# Patient Record
Sex: Male | Born: 1940 | Race: Black or African American | Hispanic: No | Smoking: Former smoker
Health system: Southern US, Community
[De-identification: ages and names within clinical notes are randomized; demographics above are authoritative.]

## PROBLEM LIST (undated history)

## (undated) DIAGNOSIS — F32A Depression, unspecified: Secondary | ICD-10-CM

## (undated) DIAGNOSIS — Z77098 Contact with and (suspected) exposure to other hazardous, chiefly nonmedicinal, chemicals: Secondary | ICD-10-CM

## (undated) DIAGNOSIS — C801 Malignant (primary) neoplasm, unspecified: Secondary | ICD-10-CM

## (undated) DIAGNOSIS — C61 Malignant neoplasm of prostate: Secondary | ICD-10-CM

## (undated) DIAGNOSIS — K219 Gastro-esophageal reflux disease without esophagitis: Secondary | ICD-10-CM

## (undated) DIAGNOSIS — Z8673 Personal history of transient ischemic attack (TIA), and cerebral infarction without residual deficits: Secondary | ICD-10-CM

## (undated) DIAGNOSIS — J329 Chronic sinusitis, unspecified: Secondary | ICD-10-CM

## (undated) DIAGNOSIS — F329 Major depressive disorder, single episode, unspecified: Secondary | ICD-10-CM

## (undated) DIAGNOSIS — I10 Essential (primary) hypertension: Secondary | ICD-10-CM

## (undated) HISTORY — DX: Gastro-esophageal reflux disease without esophagitis: K21.9

## (undated) HISTORY — DX: Malignant (primary) neoplasm, unspecified: C80.1

## (undated) HISTORY — DX: Essential (primary) hypertension: I10

## (undated) HISTORY — DX: Major depressive disorder, single episode, unspecified: F32.9

## (undated) HISTORY — DX: Contact with and (suspected) exposure to other hazardous, chiefly nonmedicinal, chemicals: Z77.098

## (undated) HISTORY — DX: Malignant neoplasm of prostate: C61

## (undated) HISTORY — PX: OTHER SURGICAL HISTORY: SHX169

## (undated) HISTORY — DX: Chronic sinusitis, unspecified: J32.9

## (undated) HISTORY — DX: Depression, unspecified: F32.A

## (undated) HISTORY — PX: TONSILLECTOMY: SUR1361

---

## 1998-06-18 HISTORY — PX: OTHER SURGICAL HISTORY: SHX169

## 1998-10-28 ENCOUNTER — Ambulatory Visit (HOSPITAL_COMMUNITY): Admission: RE | Admit: 1998-10-28 | Discharge: 1998-10-28 | Payer: Self-pay | Admitting: *Deleted

## 1999-05-22 ENCOUNTER — Encounter: Admission: RE | Admit: 1999-05-22 | Discharge: 1999-05-22 | Payer: Self-pay | Admitting: Internal Medicine

## 1999-05-22 ENCOUNTER — Encounter: Payer: Self-pay | Admitting: Internal Medicine

## 2000-04-22 ENCOUNTER — Encounter: Admission: RE | Admit: 2000-04-22 | Discharge: 2000-07-21 | Payer: Self-pay | Admitting: Endocrinology

## 2001-04-09 ENCOUNTER — Encounter: Admission: RE | Admit: 2001-04-09 | Discharge: 2001-04-09 | Payer: Self-pay | Admitting: *Deleted

## 2001-04-09 ENCOUNTER — Encounter: Payer: Self-pay | Admitting: Allergy and Immunology

## 2001-05-20 ENCOUNTER — Ambulatory Visit (HOSPITAL_COMMUNITY): Admission: RE | Admit: 2001-05-20 | Discharge: 2001-05-20 | Payer: Self-pay | Admitting: *Deleted

## 2001-05-20 ENCOUNTER — Encounter (INDEPENDENT_AMBULATORY_CARE_PROVIDER_SITE_OTHER): Payer: Self-pay | Admitting: *Deleted

## 2002-10-30 ENCOUNTER — Ambulatory Visit (HOSPITAL_BASED_OUTPATIENT_CLINIC_OR_DEPARTMENT_OTHER): Admission: RE | Admit: 2002-10-30 | Discharge: 2002-10-30 | Payer: Self-pay | Admitting: Ophthalmology

## 2004-04-03 ENCOUNTER — Ambulatory Visit (HOSPITAL_COMMUNITY): Admission: RE | Admit: 2004-04-03 | Discharge: 2004-04-03 | Payer: Self-pay | Admitting: Internal Medicine

## 2004-12-15 ENCOUNTER — Encounter: Admission: RE | Admit: 2004-12-15 | Discharge: 2004-12-15 | Payer: Self-pay | Admitting: Allergy and Immunology

## 2006-06-03 ENCOUNTER — Encounter (INDEPENDENT_AMBULATORY_CARE_PROVIDER_SITE_OTHER): Payer: Self-pay | Admitting: *Deleted

## 2006-06-03 ENCOUNTER — Ambulatory Visit (HOSPITAL_COMMUNITY): Admission: RE | Admit: 2006-06-03 | Discharge: 2006-06-03 | Payer: Self-pay | Admitting: *Deleted

## 2008-02-26 ENCOUNTER — Ambulatory Visit (HOSPITAL_COMMUNITY): Admission: RE | Admit: 2008-02-26 | Discharge: 2008-02-26 | Payer: Self-pay | Admitting: *Deleted

## 2009-05-24 ENCOUNTER — Encounter: Admission: RE | Admit: 2009-05-24 | Discharge: 2009-06-16 | Payer: Self-pay | Admitting: Internal Medicine

## 2009-06-20 ENCOUNTER — Encounter: Admission: RE | Admit: 2009-06-20 | Discharge: 2009-06-27 | Payer: Self-pay | Admitting: Internal Medicine

## 2010-04-12 DIAGNOSIS — C61 Malignant neoplasm of prostate: Secondary | ICD-10-CM

## 2010-04-12 HISTORY — DX: Malignant neoplasm of prostate: C61

## 2010-05-16 ENCOUNTER — Ambulatory Visit
Admission: RE | Admit: 2010-05-16 | Discharge: 2010-06-15 | Payer: Self-pay | Source: Home / Self Care | Attending: Radiation Oncology | Admitting: Radiation Oncology

## 2010-07-10 ENCOUNTER — Ambulatory Visit
Admission: RE | Admit: 2010-07-10 | Discharge: 2010-07-18 | Payer: Self-pay | Source: Home / Self Care | Attending: Radiation Oncology | Admitting: Radiation Oncology

## 2010-10-31 NOTE — Op Note (Signed)
NAMELASZLO, ELLERBY              ACCOUNT NO.:  [id number]   MEDICAL RECORD NO.:  [id number]          PATIENT TYPE:  AMB   LOCATION:  ENDO                         FACILITY:  [facility]   PHYSICIAN:  Georgiana Spinner, M.D.    DATE OF BIRTH:  [birth year]   DATE OF PROCEDURE:  02/26/2008  DATE OF DISCHARGE:                               OPERATIVE REPORT   PROCEDURE:  Colonoscopy.   INDICATIONS:  Rectal bleeding.   ANESTHESIA:  Demerol 50 mg, Versed 5 mg   PROCEDURE:  With the patient mildly sedated in the left lateral  decubitus position, the Pentax videoscopic colonoscope was inserted in  the rectum after a normal rectal exam, passed under direct vision to the  cecum, identified by ileocecal valve and appendiceal orifice, both of  which were photographed.  From this point the colonoscope was slowly  withdrawn taking circumferential views of colonic mucosa as we withdrew  all the way to the rectum, which appeared normal on direct and showed  internal hemorrhoids on retroflexed view.  The endoscope was  straightened and withdrawn.  The patient's vital signs and pulse  oximeter remained stable.  The patient tolerated the procedure well  without apparent complications.   FINDINGS:  Internal hemorrhoids, otherwise an unremarkable exam.   PLAN:  Have patient follow up with me in 5 years for repeat colonoscopy  or as needed.           ______________________________  Georgiana Spinner, M.D.     GMO/MEDQ  D:  02/26/2008  T:  02/26/2008  Job:  [id number]

## 2010-11-03 NOTE — Procedures (Signed)
[place]  Patient:    James Acevedo, ANDER Visit Number: [id number] MRN: [id number]          Service Type: END Location: ENDO Attending Physician:  Sabino Gasser Dictated by:   Sabino Gasser, M.D. Admit Date:  05/20/2001                             Procedure Report  PROCEDURE:  Colonoscopy.  ENDOSCOPIST:  Sabino Gasser, M.D.  INDICATIONS:  Hemoccult positivity.  ANESTHESIA:  None further given.  DESCRIPTION OF PROCEDURE:  With the patient mildly sedated in the left lateral decubitus position, a rectal exam was performed which was unremarkable. Subsequently the Olympus videoscopic colonoscope was inserted in the rectum and passed under direction vision into the cecum.  Prep was slightly suboptimal in that there were areas of solid stool, seen especially in the right colon, but the cecum was identified by ileocecal valve and appendiceal orifice, both of which were photographed.  From this point, the colonoscope was slowly withdrawn, taking circumferential views of the entire colonic mucosa, stopping only in the rectum which appeared normal on direct and showed hemorrhoids on retroflexed view.  The endoscope was straightened and withdrawn.  The patients vital signs and pulse oximetry remained stable.  The patient tolerated procedure well without apparent complications.  FINDINGS:  Internal hemorrhoids, otherwise unremarkable examination.  PLAN:  Repeat examination in five years. Dictated by:   Sabino Gasser, M.D. Attending Physician:  Sabino Gasser DD:  05/20/01 TD:  05/20/01 Job: [id number]

## 2011-08-01 ENCOUNTER — Other Ambulatory Visit (HOSPITAL_COMMUNITY): Payer: Self-pay | Admitting: Radiology

## 2011-08-01 DIAGNOSIS — G459 Transient cerebral ischemic attack, unspecified: Secondary | ICD-10-CM

## 2011-08-02 ENCOUNTER — Ambulatory Visit (HOSPITAL_COMMUNITY): Payer: Medicare Other | Attending: Cardiology | Admitting: Radiology

## 2011-08-02 DIAGNOSIS — G459 Transient cerebral ischemic attack, unspecified: Secondary | ICD-10-CM | POA: Insufficient documentation

## 2011-08-02 DIAGNOSIS — E785 Hyperlipidemia, unspecified: Secondary | ICD-10-CM | POA: Insufficient documentation

## 2011-08-02 DIAGNOSIS — Z87891 Personal history of nicotine dependence: Secondary | ICD-10-CM | POA: Insufficient documentation

## 2011-08-02 DIAGNOSIS — I10 Essential (primary) hypertension: Secondary | ICD-10-CM | POA: Insufficient documentation

## 2011-08-02 DIAGNOSIS — E669 Obesity, unspecified: Secondary | ICD-10-CM | POA: Insufficient documentation

## 2011-08-02 HISTORY — PX: PROSTATE BIOPSY: SHX241

## 2011-08-06 ENCOUNTER — Encounter (HOSPITAL_COMMUNITY): Payer: Self-pay | Admitting: Neurology

## 2011-09-27 ENCOUNTER — Encounter: Payer: Self-pay | Admitting: Radiation Oncology

## 2011-09-27 DIAGNOSIS — C61 Malignant neoplasm of prostate: Secondary | ICD-10-CM | POA: Insufficient documentation

## 2011-10-02 ENCOUNTER — Ambulatory Visit
Admission: RE | Admit: 2011-10-02 | Discharge: 2011-10-02 | Disposition: A | Discharge status: Home / Self Care | Payer: Medicare Other | Source: Ambulatory Visit | Attending: Radiation Oncology | Admitting: Radiation Oncology

## 2011-10-02 ENCOUNTER — Encounter: Payer: Self-pay | Admitting: Radiation Oncology

## 2011-10-02 VITALS — BP 123/74 | HR 58 | Temp 98.4°F | Resp 18 | Ht 74.0 in | Wt 273.6 lb

## 2011-10-02 DIAGNOSIS — C61 Malignant neoplasm of prostate: Secondary | ICD-10-CM | POA: Insufficient documentation

## 2011-10-02 DIAGNOSIS — K219 Gastro-esophageal reflux disease without esophagitis: Secondary | ICD-10-CM | POA: Insufficient documentation

## 2011-10-02 NOTE — Progress Notes (Signed)
   Treatment planning note: The patient underwent treatment planning today in preparation of his prostate seed implant. He was taken to the CT simulator. His pelvis was scanned. I contoured his prostate. The prostate volume was projected over the bony arch. There was no pubic arch interference. His prostate gland volume is approximately 30 cc with a prosthetic length of 4.2 cm. I prescribing 14,500 cGy with the Nucletron system and I-125 seeds.

## 2011-10-02 NOTE — Progress Notes (Signed)
Patient presents to the clinic today unaccompanied for a follow up new consult with Dr. Dayton Scrape reference prostate ca. Patient is alert and oriented to person, place, and time. No distress noted. Steady gait noted. Pleasant affect noted. Patient denies pain at this time. Patient reports that his weight has remained stable. Patient continues to play tennis 3-4 times per week. Patient denies nausea, vomiting, dizziness, headache, or diarrhea. Patient denies hematuria or burning with urination. Patient reports a weak urine stream but, denies "dribbling or dripping" upon urination. Patient denies difficulty emptying his bladder.   08/02/2011 PSA 4.07  IPSS 5

## 2011-10-02 NOTE — Progress Notes (Signed)
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 Barron Alvine  [name] returns today for review and consideration of radiation therapy in the management of his stage TI C. favorable risk adenocarcinoma prostate. I first saw [name] in consultation on 05/17/2010. He elected for close observation. I saw him back for a followup visit on 07/11/2010 at which time he was undecided between possible IMRT or prostate seed implantation. He continued to be followed by Dr. Isabel Caprice. His last PSA on 07/31/2011 was 4.07. Dr. Isabel Caprice repeated his biopsies on 03/29/2012 and this showed Gleason 6 (3+3) involving 50% of one core from the right lateral base, 5% of one core from right lateral apex, 5% of one core from the left lateral base and 5% of one core from the left lateral mid gland. His gland volume was 35 cc. He is doing well from a GU and GI standpoint. His I PSS score is 5. 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  Physical examination: Alert and oriented. Head and neck examination: Grossly unremarkable. Nodes: Without palpable cervical or supraclavicular lymphadenopathy. Chest: Lungs clear. Back: Without spinal or CVA tenderness. Heart: Regular rhythm. Abdomen: Without masses or organomegaly. Genitalia: Unremarkable to inspection. Rectal: The prostate gland is normal size it is without focal induration or nodularity. Extremities: Without edema.  Laboratory data: PSA 4.07 from 07/31/2011.  Impression: Stage TI C. favorable risk adenocarcinoma prostate. I explained to the patient that his management options include surgery versus seed implantation versus external beam/IMRT. Considering his financial concerns I feel that his best management option would be seed implantation. I discussed the potential acute and late toxicities in great detail. Consent is signed today. We will schedule him for a CT arch study today and then  for seed implant with Dr. Isabel Caprice in 4-6 weeks.  30 minutes was spent face-to-face with the patient, primarily counseling the patient.

## 2011-10-02 NOTE — Progress Notes (Signed)
Please see progress note under physician encounter 

## 2011-10-02 NOTE — Progress Notes (Signed)
Complete NUTRITION RISK SCREEN worksheet without concerns submitted to Zenovia Jarred, RD. Also, complete PATIENT MEASURE OF DISTRESS worksheet with a score of 3 submitted to social work.

## 2011-10-02 NOTE — Progress Notes (Addendum)
71 year old male. Retired Medical illustrator. Widower of ten years, no children. Wife died from breast cancer.   Oct 28, 2010 PSA 3.5 01/2011 PSA 4.0  Originally diagnosed with prostate cancer in October 2011 and underwent active surveillance. Prostate volume was measure to be 35 cc on 07/31/2011 at the time of repeat biopsy. Pathology proved adenocarcinoma of the prostate, Gleason 3+3=6 involves 50% of one core at right lateral base, Gleason 3+3=6 involving 5% of one core right lateral apex, 3+3=6 involving 5% of one core left lateral mid, and 3+3=6 involving 5 % of one core left lateral mid.    NKDA No indication of a pacemaker No hx of radiation therapy

## 2011-10-04 NOTE — Progress Notes (Signed)
Encounter addended by: Agnes Lawrence, RN on: 10/04/2011  3:04 PM<BR>     Documentation filed: Charges VN, Inpatient Patient Education

## 2011-10-08 ENCOUNTER — Other Ambulatory Visit: Payer: Self-pay | Admitting: Urology

## 2011-10-18 ENCOUNTER — Telehealth: Payer: Self-pay | Admitting: *Deleted

## 2011-10-18 NOTE — Telephone Encounter (Signed)
xxxxx 

## 2011-10-24 ENCOUNTER — Ambulatory Visit (HOSPITAL_BASED_OUTPATIENT_CLINIC_OR_DEPARTMENT_OTHER)
Admission: RE | Admit: 2011-10-24 | Discharge: 2011-10-24 | Disposition: A | Discharge status: Home / Self Care | Payer: Medicare Other | Source: Ambulatory Visit | Attending: Urology | Admitting: Urology

## 2011-10-24 ENCOUNTER — Telehealth: Payer: Self-pay | Admitting: *Deleted

## 2011-10-24 ENCOUNTER — Ambulatory Visit (HOSPITAL_COMMUNITY)
Admission: RE | Admit: 2011-10-24 | Discharge: 2011-10-24 | Disposition: A | Discharge status: Home / Self Care | Payer: Medicare Other | Source: Ambulatory Visit | Attending: Urology | Admitting: Urology

## 2011-10-24 DIAGNOSIS — I771 Stricture of artery: Secondary | ICD-10-CM | POA: Insufficient documentation

## 2011-10-24 DIAGNOSIS — Z01818 Encounter for other preprocedural examination: Secondary | ICD-10-CM | POA: Insufficient documentation

## 2011-10-24 DIAGNOSIS — I10 Essential (primary) hypertension: Secondary | ICD-10-CM | POA: Insufficient documentation

## 2011-10-24 DIAGNOSIS — C61 Malignant neoplasm of prostate: Secondary | ICD-10-CM | POA: Insufficient documentation

## 2011-10-24 DIAGNOSIS — I517 Cardiomegaly: Secondary | ICD-10-CM | POA: Insufficient documentation

## 2011-10-24 NOTE — Telephone Encounter (Signed)
CALLED PATIENT TO INFORM OF IMPLANT DATE BEING MOVED FROM 11-19-11 TO 11-26-11, SPOKE WITH PATIENT AND HE IS AWARE OF THIS CHANGE AND HE IS O.K. WITH IT

## 2011-11-16 ENCOUNTER — Telehealth: Payer: Self-pay | Admitting: *Deleted

## 2011-11-16 NOTE — Telephone Encounter (Signed)
Called patient to remind of appt. For 11-19-11, lvm for a return call

## 2011-11-19 ENCOUNTER — Encounter (HOSPITAL_BASED_OUTPATIENT_CLINIC_OR_DEPARTMENT_OTHER): Payer: Self-pay | Admitting: *Deleted

## 2011-11-19 LAB — CBC
Hemoglobin: 14.6 g/dL (ref 13.0–17.0)
MCH: 29.3 pg (ref 26.0–34.0)
MCHC: 33.3 g/dL (ref 30.0–36.0)
MCV: 87.8 fL (ref 78.0–100.0)
Platelets: 169 10*3/uL (ref 150–400)
RBC: 4.99 MIL/uL (ref 4.22–5.81)

## 2011-11-19 LAB — COMPREHENSIVE METABOLIC PANEL
CO2: 26 mEq/L (ref 19–32)
Calcium: 8.8 mg/dL (ref 8.4–10.5)
Creatinine, Ser: 0.98 mg/dL (ref 0.50–1.35)
GFR calc Af Amer: >90 mL/min (ref >90)
GFR calc non Af Amer: 82 mL/min — ABNORMAL LOW (ref >90)
Glucose, Bld: 120 mg/dL — ABNORMAL HIGH (ref 70–99)
Total Protein: 6.7 g/dL (ref 6.0–8.3)

## 2011-11-19 LAB — PROTIME-INR: INR: 1 (ref 0.00–1.49)

## 2011-11-19 LAB — APTT: aPTT: 26 seconds (ref 24–37)

## 2011-11-19 NOTE — Progress Notes (Signed)
NPO AFTER MN. ARRIVES AT 0615. CURRENT LAB , EKG AND CXR IN EPIC AND CHART. WILL DO SYMBICORT INHALER AM OF SURG W/ SIPS OF WATER AND DO FLEET ENEMA.

## 2011-11-23 ENCOUNTER — Telehealth: Payer: Self-pay | Admitting: *Deleted

## 2011-11-23 NOTE — Telephone Encounter (Signed)
CALLED PATIENT TO REMIND OF PROCEDURE FOR 11-26-11, LVM FOR A RETURN CALL

## 2011-11-23 NOTE — H&P (Signed)
James Acevedo is an 71 y.o. male.   Chief Complaint: Prostate cancer for seed implantation today. 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  That was diagnosed in October of 2011.  The patient was deemed a good candidate for active surveillance.  We felt that surgical intervention would be overaggressive therapy but also felt that he would be a good candidate for radiation approaches. He has seen Dr. Dayton Scrape and has discussed those options with him.  He has also been seen in the [place] system to see if that would be a more cost effective way for him to be treated.  At this point he is leaning towards having seed implantation here in [place], but is not completely ready to commit to that at this point.  Certainly given his very favorable prostate cancer, he certainly has plenty of time to make an appropriate decision and certainly again could continue with active surveillance.  PSA 4 months ago was stable at 3.5, really no change from 4 months earlier and actually down from last fall.  He has used the penile injections with variable results but is doing okay. The patient had a repeat biopsy in February of 2013. This showed a 4/12 cores positive for low volume Gleason 6 adenocarcinoma the prostate. The patient was actually seen by radiation oncology here in [place] and elected to proceed with seed implantation.  Past Medical History  Diagnosis Date  . Depression   . Asthma   . Agent orange exposure   . Hypertension   . Prostate cancer 04/12/10    gleason 3+3=6, volume 40 cc  . GERD (gastroesophageal reflux disease)   . History of TIA (transient ischemic attack) OCTOBER 2012--  NO RESIDUAL    NEURO CONSULT IN FEB 2013    Past Surgical History  Procedure Date  . Prostate biopsy 08/02/11    gleason 3+3=6, volume 35 cc  . Tonsillectomy AGE 69  . Exicison lipoma     BACK  IN 2006  AND HEAD  IN 2009  . Left wrist reconstruction 2000     Family History  Problem Relation Age of Onset  . Cancer Mother     lung  . COPD Father   . Cancer Maternal Aunt     unknown//blood?  . Cancer Maternal Uncle     unknown   Social History:  reports that he quit smoking about 33 years ago. His smoking use included Cigarettes. He has a 10 pack-year smoking history. He has never used smokeless tobacco. He reports that he drinks alcohol. He reports that he does not use illicit drugs.  Allergies: No Known Allergies  No prescriptions prior to admission    No results found for this or any previous visit (from the past 48 hour(Acevedo)). No results found.  Review of Systems - Negative except  Urinary frequency, urgency and weakening of the urinary stream. Patient also has some chronic sinus issues.  Height 6\' 2"  (1.88 m), weight 123.832 kg (273 lb). General appearance: alert, cooperative and no distress Neck: no adenopathy and no JVD Resp: clear to auscultation bilaterally Cardio: regular rate and rhythm GI: soft, non-tender; bowel sounds normal; no masses,  no organomegaly Male genitalia: normal, penis: no lesions or discharge. testes: no masses or tenderness. no hernias Pelvic: 1+ prostate without nodules or induration. Extremities: extremities normal, atraumatic, no cyanosis or edema Skin: Skin color, texture, turgor normal. No rashes or lesions Neurologic: Grossly normal  Assessment/Plan Clinical stage TI  C. Adenocarcinoma of the prostate. The patient is to have outpatient seed Implantation today.  James Acevedo 11/23/2011, 12:13 PM

## 2011-11-26 ENCOUNTER — Ambulatory Visit (HOSPITAL_BASED_OUTPATIENT_CLINIC_OR_DEPARTMENT_OTHER): Payer: Medicare Other | Admitting: Anesthesiology

## 2011-11-26 ENCOUNTER — Encounter (HOSPITAL_BASED_OUTPATIENT_CLINIC_OR_DEPARTMENT_OTHER)
Admission: RE | Disposition: A | Discharge status: Home / Self Care | Payer: Self-pay | Source: Ambulatory Visit | Attending: Urology

## 2011-11-26 ENCOUNTER — Encounter (HOSPITAL_BASED_OUTPATIENT_CLINIC_OR_DEPARTMENT_OTHER): Payer: Self-pay | Admitting: Anesthesiology

## 2011-11-26 ENCOUNTER — Ambulatory Visit (HOSPITAL_COMMUNITY): Payer: Medicare Other

## 2011-11-26 ENCOUNTER — Encounter (HOSPITAL_BASED_OUTPATIENT_CLINIC_OR_DEPARTMENT_OTHER): Payer: Self-pay | Admitting: *Deleted

## 2011-11-26 ENCOUNTER — Encounter: Payer: Self-pay | Admitting: Radiation Oncology

## 2011-11-26 ENCOUNTER — Ambulatory Visit (HOSPITAL_BASED_OUTPATIENT_CLINIC_OR_DEPARTMENT_OTHER)
Admission: RE | Admit: 2011-11-26 | Discharge: 2011-11-26 | Disposition: A | Discharge status: Home / Self Care | Payer: Medicare Other | Source: Ambulatory Visit | Attending: Urology | Admitting: Urology

## 2011-11-26 DIAGNOSIS — I10 Essential (primary) hypertension: Secondary | ICD-10-CM | POA: Insufficient documentation

## 2011-11-26 DIAGNOSIS — C61 Malignant neoplasm of prostate: Secondary | ICD-10-CM | POA: Insufficient documentation

## 2011-11-26 DIAGNOSIS — J45909 Unspecified asthma, uncomplicated: Secondary | ICD-10-CM | POA: Insufficient documentation

## 2011-11-26 DIAGNOSIS — K219 Gastro-esophageal reflux disease without esophagitis: Secondary | ICD-10-CM | POA: Insufficient documentation

## 2011-11-26 DIAGNOSIS — Z8673 Personal history of transient ischemic attack (TIA), and cerebral infarction without residual deficits: Secondary | ICD-10-CM | POA: Insufficient documentation

## 2011-11-26 HISTORY — DX: Personal history of transient ischemic attack (TIA), and cerebral infarction without residual deficits: Z86.73

## 2011-11-26 HISTORY — PX: CYSTOSCOPY: SHX5120

## 2011-11-26 HISTORY — PX: RADIOACTIVE SEED IMPLANT: SHX5150

## 2011-11-26 SURGERY — INSERTION, RADIATION SOURCE, PROSTATE
Anesthesia: General | Site: Prostate | Wound class: Clean Contaminated

## 2011-11-26 MED ORDER — STERILE WATER FOR IRRIGATION IR SOLN
Status: DC | PRN
  Administered 2011-11-26: 3000 mL

## 2011-11-26 MED ORDER — CIPROFLOXACIN IN D5W 400 MG/200ML IV SOLN
400.0000 mg | INTRAVENOUS | Status: AC
  Administered 2011-11-26: 400 mg via INTRAVENOUS

## 2011-11-26 MED ORDER — PROPOFOL 10 MG/ML IV EMUL
INTRAVENOUS | Status: DC | PRN
  Administered 2011-11-26: 50 mg via INTRAVENOUS
  Administered 2011-11-26: 100 mg via INTRAVENOUS
  Administered 2011-11-26: 200 mg via INTRAVENOUS

## 2011-11-26 MED ORDER — FENTANYL CITRATE 0.05 MG/ML IJ SOLN
INTRAMUSCULAR | Status: DC | PRN
  Administered 2011-11-26: 50 ug via INTRAVENOUS
  Administered 2011-11-26: 25 ug via INTRAVENOUS
  Administered 2011-11-26: 50 ug via INTRAVENOUS
  Administered 2011-11-26: 25 ug via INTRAVENOUS
  Administered 2011-11-26: 50 ug via INTRAVENOUS

## 2011-11-26 MED ORDER — FENTANYL CITRATE 0.05 MG/ML IJ SOLN
25.0000 ug | INTRAMUSCULAR | Status: DC | PRN
  Administered 2011-11-26: 25 ug via INTRAVENOUS

## 2011-11-26 MED ORDER — LIDOCAINE HCL (CARDIAC) 20 MG/ML IV SOLN
INTRAVENOUS | Status: DC | PRN
  Administered 2011-11-26: 80 mg via INTRAVENOUS

## 2011-11-26 MED ORDER — FLEET ENEMA 7-19 GM/118ML RE ENEM: 1.0000 | ENEMA | Freq: Once | RECTAL | Status: DC

## 2011-11-26 MED ORDER — ONDANSETRON HCL 4 MG/2ML IJ SOLN
INTRAMUSCULAR | Status: DC | PRN
  Administered 2011-11-26: 4 mg via INTRAVENOUS

## 2011-11-26 MED ORDER — LIDOCAINE HCL 2 % EX GEL
CUTANEOUS | Status: DC | PRN
  Administered 2011-11-26: 1 via URETHRAL

## 2011-11-26 MED ORDER — HYDROCODONE-ACETAMINOPHEN 5-325 MG PO TABS
1.0000 | ORAL_TABLET | Freq: Four times a day (QID) | ORAL | Status: DC | PRN
  Administered 2011-11-26: 1 via ORAL

## 2011-11-26 MED ORDER — HYDROCODONE-ACETAMINOPHEN 5-325 MG PO TABS: 1.0000 | ORAL_TABLET | Freq: Four times a day (QID) | ORAL | Status: AC | PRN

## 2011-11-26 MED ORDER — CIPROFLOXACIN HCL 500 MG PO TABS: 500.0000 mg | ORAL_TABLET | Freq: Two times a day (BID) | ORAL | Status: AC

## 2011-11-26 MED ORDER — DEXAMETHASONE SODIUM PHOSPHATE 4 MG/ML IJ SOLN
INTRAMUSCULAR | Status: DC | PRN
  Administered 2011-11-26: 10 mg via INTRAVENOUS

## 2011-11-26 MED ORDER — ALBUTEROL SULFATE HFA 108 (90 BASE) MCG/ACT IN AERS
INHALATION_SPRAY | RESPIRATORY_TRACT | Status: DC | PRN
  Administered 2011-11-26: 2 via RESPIRATORY_TRACT

## 2011-11-26 MED ORDER — MIDAZOLAM HCL 5 MG/5ML IJ SOLN
INTRAMUSCULAR | Status: DC | PRN
  Administered 2011-11-26: 2 mg via INTRAVENOUS

## 2011-11-26 MED ORDER — LACTATED RINGERS IV SOLN
INTRAVENOUS | Status: DC
  Administered 2011-11-26 (×2): via INTRAVENOUS

## 2011-11-26 MED ORDER — IOHEXOL 350 MG/ML SOLN
INTRAVENOUS | Status: DC | PRN
  Administered 2011-11-26: 7 mL via INTRAVENOUS

## 2011-11-26 SURGICAL SUPPLY — 26 items
BAG URINE DRAINAGE (UROLOGICAL SUPPLIES) ×3 IMPLANT
BLADE SURG ROTATE 9660 (MISCELLANEOUS) ×3 IMPLANT
CATH FOLEY 2WAY SLVR  5CC 16FR (CATHETERS) ×2
CATH FOLEY 2WAY SLVR 5CC 16FR (CATHETERS) ×4 IMPLANT
CATH ROBINSON RED A/P 20FR (CATHETERS) ×3 IMPLANT
CLOTH BEACON ORANGE TIMEOUT ST (SAFETY) ×3 IMPLANT
COVER MAYO STAND STRL (DRAPES) ×3 IMPLANT
COVER TABLE BACK 60X90 (DRAPES) ×3 IMPLANT
DRSG TEGADERM 4X4.75 (GAUZE/BANDAGES/DRESSINGS) ×3 IMPLANT
DRSG TEGADERM 8X12 (GAUZE/BANDAGES/DRESSINGS) ×3 IMPLANT
GLOVE BIO SURGEON STRL SZ7 (GLOVE) ×3 IMPLANT
GLOVE BIO SURGEON STRL SZ7.5 (GLOVE) ×18 IMPLANT
GLOVE BIOGEL PI IND STRL 7.0 (GLOVE) ×2 IMPLANT
GLOVE BIOGEL PI INDICATOR 7.0 (GLOVE) ×1
GLOVE ECLIPSE 8.0 STRL XLNG CF (GLOVE) IMPLANT
GOWN STRL REIN XL XLG (GOWN DISPOSABLE) ×3 IMPLANT
GOWN SURGICAL LARGE (GOWNS) ×6 IMPLANT
HOLDER FOLEY CATH W/STRAP (MISCELLANEOUS) ×3 IMPLANT
NUCLETRON  SELECTSEED ×3 IMPLANT
PACK CYSTOSCOPY (CUSTOM PROCEDURE TRAY) ×3 IMPLANT
SPONGE GAUZE 4X4 12PLY (GAUZE/BANDAGES/DRESSINGS) ×3 IMPLANT
SYRINGE 10CC LL (SYRINGE) ×3 IMPLANT
TOWEL NATURAL 6PK STERILE (DISPOSABLE) ×6 IMPLANT
UNDERPAD 30X30 INCONTINENT (UNDERPADS AND DIAPERS) ×6 IMPLANT
WATER STERILE IRR 3000ML UROMA (IV SOLUTION) ×3 IMPLANT
WATER STERILE IRR 500ML POUR (IV SOLUTION) ×3 IMPLANT

## 2011-11-26 NOTE — Anesthesia Preprocedure Evaluation (Signed)
Anesthesia Evaluation  Patient identified by MRN, date of birth, ID band Patient awake    Reviewed: Allergy & Precautions, H&P , NPO status , Patient's Chart, lab work & pertinent test results, reviewed documented beta blocker date and time   Airway Mallampati: II TM Distance: >3 FB Neck ROM: Full    Dental  (+) Teeth Intact and Dental Advisory Given   Pulmonary asthma ,  Mild asthma breath sounds clear to auscultation        Cardiovascular hypertension, Pt. on medications Rhythm:Regular Rate:Normal + Systolic murmurs Denies cardiac symptoms   Neuro/Psych Small vessel Dz TIAnegative psych ROS   GI/Hepatic negative GI ROS, Neg liver ROS,   Endo/Other  negative endocrine ROS  Renal/GU negative Renal ROS   Prostate cancer    Musculoskeletal negative musculoskeletal ROS (+)   Abdominal   Peds negative pediatric ROS (+)  Hematology negative hematology ROS (+)   Anesthesia Other Findings   Reproductive/Obstetrics negative OB ROS                           Anesthesia Physical Anesthesia Plan  ASA: III  Anesthesia Plan: General   Post-op Pain Management:    Induction: Intravenous  Airway Management Planned: LMA  Additional Equipment:   Intra-op Plan:   Post-operative Plan:   Informed Consent:   Dental advisory given  Plan Discussed with: CRNA and Surgeon  Anesthesia Plan Comments:         Anesthesia Quick Evaluation

## 2011-11-26 NOTE — Op Note (Signed)
Preoperative diagnosis: Clinical stage TI C adenocarcinoma the prostate  Postoperative diagnosis: Same  Procedure: I-125 prostate seed implantation with Nucletron robotic implanter  Surgeon: Valetta Fuller M.D. , Chipper Herb, M.D.  Anesthesia: Gen.  Indications: Patient  was diagnosed with clinical stage TI C prostate cancer. We had extensive discussion with him about treatment options versus. He elected to proceed with seed implantation. He underwent consultation my office as well as with Dr. Chipper Herb. He appeared to understand the advantages disadvantages potential risks of this treatment option. Full informed consent has been obtained. The patient is had preoperative ciprofloxacin. PAS compression boots were placed.  Technique and findings: Patient was brought the operating room where he had successful induction of general anesthesia. He was placed in lithotomy position and prepped and draped in usual manner. Appropriate surgical timeout was performed. Radiation oncology department placed a transrectal ultrasound probe anchoring stand. Foley catheter with contrast in the balloon was inserted without difficulty. Anchoring needles were placed within the prostate. Real-time contouring of the urethra prostate and rectum were performed and the dosing parameters were established. Targeted dose was 145 gray. We then came to the operating suite suite for placement of the needles. A second timeout was performed. All needle passage was done with real-time transrectal ultrasound guidance with the sagittal plane. A total of 25 needles were placed. Seed implantation itself was done with the robotic implanter. 68 active seeds were implanted. A Foley catheter was removed and flexible cystoscopy failed to show any seeds outside the prostate. The Foley catheter was inserted which drained clear urine. The patient was brought to recovery room in stable condition.

## 2011-11-26 NOTE — Progress Notes (Signed)
Complex simulation note: On April 16 the patient was taken to the CT simulator for complex simulation. His pelvis was scanned. I contoured the prostate and projected the prostate volume over the bony arch. Please refer to my treatment planning note for his prescription.

## 2011-11-26 NOTE — Interval H&P Note (Signed)
History and Physical Interval Note:  11/26/2011 7:28 AM  James Acevedo  has presented today for surgery, with the diagnosis of PROSTATE CANCER  The various methods of treatment have been discussed with the patient and family. After consideration of risks, benefits and other options for treatment, the patient has consented to  Procedure(s) (LRB): RADIOACTIVE SEED IMPLANT (N/A) as a surgical intervention .  The patients' history has been reviewed, patient examined, no change in status, stable for surgery.  I have reviewed the patients' chart and labs.  Questions were answered to the patient's satisfaction.     Robbert Langlinais S

## 2011-11-26 NOTE — Anesthesia Procedure Notes (Signed)
Procedure Name: LMA Insertion Date/Time: 11/26/2011 7:35 AM Performed by: Maris Berger T Pre-anesthesia Checklist: Patient identified, Emergency Drugs available, Suction available and Patient being monitored Patient Re-evaluated:Patient Re-evaluated prior to inductionOxygen Delivery Method: Circle System Utilized Preoxygenation: Pre-oxygenation with 100% oxygen Intubation Type: IV induction Ventilation: Mask ventilation without difficulty LMA: LMA inserted LMA Size: 5.0 Number of attempts: 1 Airway Equipment and Method: bite block Placement Confirmation: positive ETCO2 Dental Injury: Teeth and Oropharynx as per pre-operative assessment

## 2011-11-26 NOTE — Progress Notes (Signed)
End of treatment summary:  Diagnosis: Stage TI C. favorable risk adenocarcinoma of the prostate  Requesting physician: Dr. Barron Alvine  Urologist: Dr. Barron Alvine  Intent: Curative  Date of implant: 11/26/2011  Site/dose:  Prostate  14,500 cGy  Isotope: I-125 utilizing 68 seeds in 23 active needles. Individual seed activity 0.408 mCi per seed with a total activity of 27.74 mCi  Narrative: [name] appears to have undergone a successful Nucletron seed implant with Dr. Barron Alvine.  Plan: Followup visit to see Dr. Isabel Caprice and myself in approximately 3 weeks. He will have a CT scan at that time to assess the quality of his seed implant.

## 2011-11-26 NOTE — Progress Notes (Signed)
[facility] Radiation Oncology Brachytherapy Operative Procedure Note  Name: Shine Mikes MRN: [id number]  Date:   10/08/2011           DOB: 1941/03/09  Status:outpatient    CC:KIM, Fayrene Fearing, MD, MD  Dr. Barron Alvine DIAGNOSIS: A 71 year old gentlemen with stage T1 C. adenocarcinoma of the prostate with a Gleason of 6 and a PSA of 4.07.  PROCEDURE: Insertion of radioactive I-125 seeds into the prostate gland.  RADIATION DOSE: 14,500 cGy, definitive.  TECHNIQUE: Kendarius Vigen was brought to the operating room with Dr. Isabel Caprice. He was placed in the dorsolithotomy position. He was catheterized and a rectal tube was inserted. The perineum was shaved, prepped and draped. The ultrasound probe was then introduced into the rectum to see the prostate gland.  TREATMENT DEVICE: A needle grid was attached to the ultrasound probe stand and anchor needles were placed.  COMPLEX ISODOSE CALCULATION: The prostate was imaged in 3D using a sagittal sweep of the prostate probe. These images were transferred to the planning computer. There, the prostate, urethra and rectum were defined on each axial reconstructed image. Then, the software created an optimized plan and a few seed positions were adjusted. Then the accepted plan was uploaded to the seed Selectron afterloading unit.  SPECIAL TREATMENT PROCEDURE/SUPERVISION AND HANDLING: The Nucletron FIRST system was used to place the needles under sagittal guidance. A total of 23 needles were used to deposit 68 seeds in the prostate gland. The individual seed activity was 0.408 mCi for a total implant activity of 27.74 mCi.  COMPLEX SIMULATION: At the end of the procedure, an anterior radiograph of the pelvis was obtained to document seed positioning and count. Cystoscopy was performed to check the urethra and bladder.  MICRODOSIMETRY: At the end of the procedure, the patient was emitting 0.13  mrem/hr at 1 meter. Accordingly, he was considered safe for  hospital discharge.  PLAN: The patient will return to the radiation oncology clinic for post implant CT dosimetry in three weeks.

## 2011-11-26 NOTE — Discharge Instructions (Addendum)
DISCHARGE INSTRUCTIONS FOR PROSTATE SEED IMPLANTATION  Removal of catheter Remove the foley catheter after 24 hours ( day after the procedure).can be done easily by cutting the side port of the catheter, whichallow the balloon to deflate.  You will see 1-2 teaspoons of clear water as the balloon deflates and then the catheter can be slid out without difficulty.        Cut here  Antibiotics You may be given a prescription for an antibiotic to take when you arrive home. If so, be sure to take every tablet in the bottle, even if you are feeling better before the prescription is finished. If you begin itching, notice a rash or start to swell on your trunk, arms, legs and/or throat, immediately stop taking the antibiotic and call your Urologist. Diet Resume your usual diet when you return home. To keep your bowels moving easily and softly, drink prune, apple and cranberry juice at room temperature. You may also take a stool softener, such as Colace, which is available without prescription at local pharmacies. Daily activities   No driving or heavy lifting for at least two days after the implant.   No bike riding, horseback riding or riding lawn mowers for the first month after the implant.   Any strenuous physical activity should be approved by your doctor before you resume it. Sexual relations You may resume sexual relations two weeks after the procedure. A condom should be used for the first two weeks. Your semen may be dark brown or black; this is normal and is related bleeding that may have occurred during the implant. Postoperative swelling Expect swelling and bruising of the scrotum and perineum (the area between the scrotum and anus). Both the swelling and the bruising should resolve in l or 2 weeks. Ice packs and over- the-counter medications such as Tylenol, Advil or Aleve may lessen your discomfort. Postoperative urination Most men experience burning on urination and/or urinary frequency.  If this becomes bothersome, contact your Urologist.  Medication can be prescribed to relieve these problems.  It is normal to have some blood in your urine for a few days after the implant. Special instructions related to the seeds It is unlikely that you will pass an Iodine-125 seed in your urine. The seeds are silver in color and are about as large as a grain of rice. If you pass a seed, do not handle it with your fingers. Use a spoon to place it in an envelope or jar in place this in base occluded area such as the garage or basement for return to the radiation clinic at your convenience.  Contact your doctor for   Temperature greater than 101 F   Increasing pain   Inability to urinate Follow-up  You should have follow up with your urologist and radiation oncologist about 3 weeks after the procedure. General information regarding Iodine seeds   Iodine-125 is a low energy radioactive material. It is not deeply penetrating and loses energy at short distances. Your prostate will absorb the radiation. Objects that are touched or used by the patient do not become radioactive.   Body wastes (urine and stool) or body fluids (saliva, tears, semen or blood) are not radioactive.   The Nuclear Regulatory Commission [facility]) has determined that no radiation precautions are needed for patients undergoing Iodine-125 seed implantation. The [facility] states that such patients do not present a risk to the people around them, including young children and pregnant women. However, in keeping with the general principle  that radiation exposure should be kept as low reasonably possible, we suggest the following:   Children and pets should not sit on the patient's lap for the first two (2) weeks after the implant.   Pregnant (or possibly pregnant) women should avoid prolonged, close contact with the patient for the first two (2) weeks after the implant.   A distance of three (3) feet is acceptable.   At a distance of three (3)  feet, there is no limit to the length of time anyone can be with the patient.    MAY RESTART PLAVIX IN 2 DAYS  May restart Plavix in 2 day Post Anesthesia Home Care Instructions  Activity: Get plenty of rest for the remainder of the day. A responsible adult should stay with you for 24 hours following the procedure.  For the next 24 hours, DO NOT: -Drive a car -Advertising copywriter -Drink alcoholic beverages -Take any medication unless instructed by your physician -Make any legal decisions or sign important papers.  Meals: Start with liquid foods such as gelatin or soup. Progress to regular foods as tolerated. Avoid greasy, spicy, heavy foods. If nausea and/or vomiting occur, drink only clear liquids until the nausea and/or vomiting subsides. Call your physician if vomiting continues.  Special Instructions/Symptoms: Your throat may feel dry or sore from the anesthesia or the breathing tube placed in your throat during surgery. If this causes discomfort, gargle with warm salt water. The discomfort should disappear within 24 hours.    s

## 2011-11-26 NOTE — Transfer of Care (Signed)
Immediate Anesthesia Transfer of Care Note  Patient: James Acevedo  Procedure(s) Performed: Procedure(s) (LRB): RADIOACTIVE SEED IMPLANT (N/A) CYSTOSCOPY (N/A)  Patient Location: PACU  Anesthesia Type: General  Level of Consciousness: awake and sedated  Airway & Oxygen Therapy: Patient Spontanous Breathing and Patient connected to nasal cannula oxygen  Post-op Assessment: Report given to PACU RN and Post -op Vital signs reviewed and stable  Post vital signs: Reviewed and stable  Complications: No apparent anesthesia complications

## 2011-11-26 NOTE — Anesthesia Postprocedure Evaluation (Signed)
  Anesthesia Post-op Note  Patient: James Acevedo  Procedure(s) Performed: Procedure(s) (LRB): RADIOACTIVE SEED IMPLANT (N/A) CYSTOSCOPY (N/A)  Patient Location: PACU  Anesthesia Type: General  Level of Consciousness: oriented and sedated  Airway and Oxygen Therapy: Patient Spontanous Breathing and Patient connected to nasal cannula oxygen  Post-op Pain: mild  Post-op Assessment: Post-op Vital signs reviewed, Patient's Cardiovascular Status Stable, Respiratory Function Stable and Patent Airway  Post-op Vital Signs: stable  Complications: No apparent anesthesia complications

## 2011-11-26 NOTE — Telephone Encounter (Signed)
xxxxx 

## 2011-11-27 ENCOUNTER — Encounter (HOSPITAL_BASED_OUTPATIENT_CLINIC_OR_DEPARTMENT_OTHER): Payer: Self-pay | Admitting: Urology

## 2011-12-11 ENCOUNTER — Encounter: Payer: Self-pay | Admitting: Radiation Oncology

## 2011-12-17 ENCOUNTER — Telehealth: Payer: Self-pay | Admitting: *Deleted

## 2011-12-17 NOTE — Telephone Encounter (Signed)
CALLED PATIENT TO REMIND OF APPTS. FOR 12-18-11, CONFIRMED APPTS. WITH PATIENT.

## 2011-12-18 ENCOUNTER — Encounter: Payer: Self-pay | Admitting: Radiation Oncology

## 2011-12-18 ENCOUNTER — Ambulatory Visit
Admission: RE | Admit: 2011-12-18 | Discharge: 2011-12-18 | Disposition: A | Discharge status: Home / Self Care | Payer: Medicare Other | Source: Ambulatory Visit | Attending: Radiation Oncology | Admitting: Radiation Oncology

## 2011-12-18 VITALS — BP 109/60 | HR 57 | Temp 97.5°F | Resp 20 | Wt 277.3 lb

## 2011-12-18 DIAGNOSIS — C61 Malignant neoplasm of prostate: Secondary | ICD-10-CM

## 2011-12-18 NOTE — Progress Notes (Signed)
Followup note: [name] returns today approximately 6 weeks following his prostate seed implant with Dr. 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 Following his implant he had slowing of his urinary stream but this is improved. He feels that he can now emptying his bladder completely. He is back to his usual activities including playing tennis. No GI difficulties. He saw Dr. Isabel Caprice this morning. He'll see Dr. Isabel Caprice for a followup visit and PSA determination in 3 months.  Physical examination: Not performed.  His CT scan today for his post implant dosimetry shows what appears to be an excellent seed distribution.  Impression: Satisfactory progress.  Plan: We'll move ahead with his post implant dosimetry and forward  the results Dr. Isabel Caprice. I ask that Dr. Isabel Caprice keep me posted on his progress.

## 2011-12-18 NOTE — Progress Notes (Signed)
Pt reports slow urinary stream, difficulty emptying bladder, nocturia x 2.  Denies dysuria, bowel issues, fatigue, loss of appetite. Urinary urgency is improving.

## 2011-12-18 NOTE — Progress Notes (Signed)
Complex simulation note: The patient was taken to the CT simulator. His pelvis was scanned. He appears to be a good seed distribution from the base of the apex. There are no seeds within the rectum. The CT data set was sent to the [facility] system for contouring of his prostate and rectum to assess the quality of his implant. He is now ready for 3-D simulation.

## 2011-12-23 ENCOUNTER — Encounter: Payer: Self-pay | Admitting: Radiation Oncology

## 2011-12-23 NOTE — Progress Notes (Signed)
Post implant CT dosimetry/3-D simulation note:  James Acevedo underwent post-implant CT dosimetry/3-D simulation to assess the quality of his implant. His intraoperative prostate volume by ultrasound was 30.6 cc while his postoperative prostate volume by CT was 29.8 cc, and excellent correlation. Dose volume histograms were obtained for his prostate and rectum. His prostate D 90 is 111.9% and V100 94.38%, both excellent. Only 0.1 cc of rectum received the prescribed dose of 14,500 cGy. In summary, James Acevedo has excellent post implant dosimetry with a low risk for late rectal toxicity.

## 2012-02-08 ENCOUNTER — Encounter: Payer: Self-pay | Admitting: Radiation Oncology

## 2012-10-10 ENCOUNTER — Ambulatory Visit: Payer: Self-pay | Admitting: Neurology

## 2012-10-15 ENCOUNTER — Encounter: Payer: Self-pay | Admitting: Neurology

## 2012-10-15 ENCOUNTER — Ambulatory Visit (INDEPENDENT_AMBULATORY_CARE_PROVIDER_SITE_OTHER): Payer: Medicare Other | Admitting: Neurology

## 2012-10-15 VITALS — BP 130/75 | Ht 72.0 in | Wt 290.0 lb

## 2012-10-15 DIAGNOSIS — I10 Essential (primary) hypertension: Secondary | ICD-10-CM

## 2012-10-15 DIAGNOSIS — Z8673 Personal history of transient ischemic attack (TIA), and cerebral infarction without residual deficits: Secondary | ICD-10-CM

## 2012-10-15 DIAGNOSIS — Z77098 Contact with and (suspected) exposure to other hazardous, chiefly nonmedicinal, chemicals: Secondary | ICD-10-CM

## 2012-10-15 DIAGNOSIS — F329 Major depressive disorder, single episode, unspecified: Secondary | ICD-10-CM | POA: Insufficient documentation

## 2012-10-15 DIAGNOSIS — C801 Malignant (primary) neoplasm, unspecified: Secondary | ICD-10-CM | POA: Insufficient documentation

## 2012-10-15 MED ORDER — CLOPIDOGREL BISULFATE 75 MG PO TABS: 75.0000 mg | ORAL_TABLET | Freq: Every day | ORAL | Status: DC

## 2012-10-15 NOTE — Progress Notes (Signed)
HPI: [name] is a 72 year old right-handed African American male, referred by his primary care physician Dr. Selena Batten for evaluation of possible TIA, I saw him last in March 2013.  He has long-standing hypertension for 30 years, recent diagnosis of hyperlipidemia, is LDL 121, has been taking baby aspirin for many years,  In April 13, 2011, while hosting a party at home, he noticed left mouth droopy, difficulty talking, lasting for 5-10 minutes, he denies sensory change, he denied associated left upper or  lower extremity weakness,  recent laboratory evaluation May 08, 2011, showed normal CBC, CMP with the exception of elevated creatinine 1.4, cholesterol 176, LDL 121, he was started on Pravachol. Elevated PSA 4.43  MRI brain showed small vessel disease, no acute lesions.  [place] cartoid artery was normal. ECHO was normal. he is on plavix now, no recurrent symptoms.  He is doing very well, no recurrent symptoms, came in today, asking for refill of plavix.        Physical Exam  Neck: supple no carotid bruits Respiratory: clear to auscultation bilaterally Cardiovascular: regular rate rhythm  Neurologic Exam  Mental Status: pleasant, awake, alert, cooperative to history, talking, and casual conversation. Cranial Nerves: CN II-XII pupils were equal round reactive to light.  Fundi were sharp bilaterally.  Extraocular movements were full.  Visual fields were full on confrontational test.  Facial sensation and strength were normal.  Hearing was intact to finger rubbing bilaterally.  Uvula tongue were midline.  Head turning and shoulder shrugging were normal and symmetric.  Tongue protrusion into the cheeks strength were normal.  Motor: Normal tone, bulk, and strength. Sensory: Normal to light touch, pinprick, proprioception, and vibratory sensation. Coordination: Normal finger-to-nose, heel-to-shin.  There was no dysmetria noticed. Gait and Station: Narrow based and steady, was able to perform  tiptoe, heel, and tandem walking without difficulty.  Romberg sign: Negative Reflexes: Deep tendon reflexes: Biceps: 2/2, Brachioradialis: 2/2, Triceps: 2/2, Pateller: 2/2, Achilles: 2/2.  Plantar responses are flexor.   Assessment and Plan: 72 yo right-handed [place] American male, with past medical history of hypertension, recent diagnosis of hyperlipidemia, had transient left lower face weakness in April 13, 2011, most suggestive of a TIA episode. 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 keep Plavix 75 mg every day 2.  moderate exercise. 3. RTC prn

## 2013-04-18 ENCOUNTER — Ambulatory Visit (INDEPENDENT_AMBULATORY_CARE_PROVIDER_SITE_OTHER): Payer: Medicare Other | Admitting: Family Medicine

## 2013-04-18 VITALS — BP 136/82 | HR 62 | Temp 98.4°F | Resp 18 | Ht 73.75 in | Wt 286.6 lb

## 2013-04-18 DIAGNOSIS — R070 Pain in throat: Secondary | ICD-10-CM

## 2013-04-18 LAB — POCT SKIN KOH: Skin KOH, POC: NEGATIVE

## 2013-04-18 LAB — POCT RAPID STREP A (OFFICE): Rapid Strep A Screen: NEGATIVE

## 2013-04-18 MED ORDER — FIRST-DUKES MOUTHWASH MT SUSP: OROMUCOSAL | Status: DC

## 2013-04-18 MED ORDER — CEFDINIR 300 MG PO CAPS: 300.0000 mg | ORAL_CAPSULE | Freq: Two times a day (BID) | ORAL | Status: DC

## 2013-04-18 NOTE — Progress Notes (Signed)
[facility] [address] [place] [address] [id number] [phone]  Date:  04/18/2013   Name:  James Acevedo   DOB:  August 03, 1940   MRN:  [id number]  PCP:  Pearson Grippe, MD    Chief Complaint: Sore Throat   History of Present Illness:  James Acevedo is a 72 y.o. very pleasant male patient who presents with the following:  He has noted a ST since last night.  He is concerned that he has strep.  He played tennis yesterday and did not change out of his sweaty clothes prior to taking a nap.  He thinks this is why he got sick.  He has not had any fever.   He does not have a cough  Patient Active Problem List   Diagnosis Date Noted  . Depression   . Agent orange exposure   . Hypertension   . History of TIA (transient ischemic attack)   . Cancer   . GERD (gastroesophageal reflux disease)   . Prostate cancer 09/27/2011    Past Medical History  Diagnosis Date  . Depression   . Asthma   . Agent orange exposure   . Hypertension   . Prostate cancer 04/12/10    gleason 3+3=6, volume 40 cc  . GERD (gastroesophageal reflux disease)   . History of TIA (transient ischemic attack) OCTOBER 2012--  NO RESIDUAL    NEURO CONSULT IN FEB 2013  . Cancer   . Sinus abscess     Past Surgical History  Procedure Laterality Date  . Prostate biopsy  08/02/11    gleason 3+3=6, volume 35 cc  . Tonsillectomy  AGE 18  . Exicison lipoma      BACK  IN 2006  AND HEAD  IN 2009  . Left wrist reconstruction  2000  . Radioactive seed implant  11/26/2011    Procedure: RADIOACTIVE SEED IMPLANT;  Surgeon: Valetta Fuller, MD;  Location: [facility];  Service: Urology;  Laterality: N/A;   70 SEEDS IMPLANTED  . Cystoscopy  11/26/2011    Procedure: CYSTOSCOPY;  Surgeon: Valetta Fuller, MD;  Location: [facility];  Service: Urology;  Laterality: N/A;  NO SEEDS FOUND IN BLADDER    History  Substance Use Topics  . Smoking status: Former Smoker -- 1.00 packs/day for 10  years    Types: Cigarettes    Quit date: 06/18/1978  . Smokeless tobacco: Never Used  . Alcohol Use: No     Comment: drinks socially    Family History  Problem Relation Age of Onset  . Cancer Mother     lung  . COPD Father   . Cancer Maternal Aunt     unknown//blood?  . Cancer Maternal Uncle     unknown    No Known Allergies  Medication list has been reviewed and updated.  Current Outpatient Prescriptions on File Prior to Visit  Medication Sig Dispense Refill  . ALBUTEROL IN Inhale into the lungs as needed.      . bisoprolol-hydrochlorothiazide (ZIAC) 5-6.25 MG per tablet Take 1 tablet by mouth daily.      . budesonide-formoterol (SYMBICORT) 80-4.5 MCG/ACT inhaler Inhale 2 puffs into the lungs every morning. And PRN      . buPROPion (ZYBAN) 150 MG 12 hr tablet Take 150 mg by mouth every evening.       . cholecalciferol (VITAMIN D) 1000 UNITS tablet Take 1,000 Units by mouth daily.      . clopidogrel (  PLAVIX) 75 MG tablet Take 1 tablet (75 mg total) by mouth daily.  30 tablet  12  . levofloxacin (LEVAQUIN) 500 MG tablet 500 mg daily.      Marland Kitchen loratadine (CLARITIN) 10 MG tablet Take 10 mg by mouth every morning.       . Multiple Vitamin (MULTIVITAMIN) tablet Take 1 tablet by mouth daily.      . pantoprazole (PROTONIX) 40 MG tablet Take 40 mg by mouth every evening.      . pravastatin (PRAVACHOL) 20 MG tablet Take 20 mg by mouth every evening.       . Tamsulosin HCl (FLOMAX) 0.4 MG CAPS Take 0.4 mg by mouth daily after supper.       . valsartan (DIOVAN) 320 MG tablet Take 320 mg by mouth every evening.       No current facility-administered medications on file prior to visit.    Review of Systems:  As per HPI- otherwise negative.   Physical Examination: Filed Vitals:   04/18/13 1523  BP: 136/82  Pulse: 62  Temp: 98.4 F (36.9 C)  Resp: 18   Filed Vitals:   04/18/13 1523  Height: 6' 1.75" (1.873 m)  Weight: 286 lb 9.6 oz (130.001 kg)   Body mass index is 37.06  kg/(m^2). Ideal Body Weight: Weight in (lb) to have BMI = 25: 193  GEN: WDWN, NAD, Non-toxic, A & O x 3, obese, looks well HEENT: Atraumatic, Normocephalic. Neck supple. No masses, No LAD.  Bilateral TM wnl, oropharynx is injected and reveals white patches bilaterally.  PEERL,EOMI.   Ears and Nose: No external deformity. CV: RRR, No M/G/R. No JVD. No thrill. No extra heart sounds. PULM: CTA B, no wheezes, crackles, rhonchi. No retractions. No resp. distress. No accessory muscle use. ABD: S, NT, ND EXTR: No c/c/e NEURO Normal gait.  PSYCH: Normally interactive. Conversant. Not depressed or anxious appearing.  Calm demeanor.   Results for orders placed in visit on 04/18/13  POCT RAPID STREP A (OFFICE)      Result Value Range   Rapid Strep A Screen Negative  Negative  POCT SKIN KOH      Result Value Range   Skin KOH, POC Negative       Assessment and Plan: Throat pain - Plan: POCT rapid strep A, Culture, Group A Strep, POCT Skin KOH, cefdinir (OMNICEF) 300 MG capsule, Diphenhyd-Hydrocort-Nystatin (FIRST-DUKES MOUTHWASH) SUSP  Exudative pharyngitis.  Treat with omnicef and DMM as needed.  If not better in the next couple of days he will let me know.   Await culture  Signed Abbe Amsterdam, MD

## 2013-04-18 NOTE — Patient Instructions (Signed)
Use the medications as directed.  I will be in touch with your throat culture when it comes in.  If you are not better in the next 2 days or so please let me know- Sooner if worse.

## 2013-04-21 LAB — CULTURE, GROUP A STREP: Organism ID, Bacteria: NORMAL

## 2013-10-29 ENCOUNTER — Other Ambulatory Visit: Payer: Self-pay | Admitting: Neurology

## 2014-07-06 DIAGNOSIS — N5201 Erectile dysfunction due to arterial insufficiency: Secondary | ICD-10-CM | POA: Diagnosis not present

## 2014-07-06 DIAGNOSIS — Z8546 Personal history of malignant neoplasm of prostate: Secondary | ICD-10-CM | POA: Diagnosis not present

## 2014-07-07 DIAGNOSIS — J309 Allergic rhinitis, unspecified: Secondary | ICD-10-CM | POA: Diagnosis not present

## 2014-08-03 DIAGNOSIS — J309 Allergic rhinitis, unspecified: Secondary | ICD-10-CM | POA: Diagnosis not present

## 2014-08-06 DIAGNOSIS — M7022 Olecranon bursitis, left elbow: Secondary | ICD-10-CM | POA: Diagnosis not present

## 2014-09-08 DIAGNOSIS — J309 Allergic rhinitis, unspecified: Secondary | ICD-10-CM | POA: Diagnosis not present

## 2014-09-15 DIAGNOSIS — J309 Allergic rhinitis, unspecified: Secondary | ICD-10-CM | POA: Diagnosis not present

## 2014-09-23 DIAGNOSIS — J309 Allergic rhinitis, unspecified: Secondary | ICD-10-CM | POA: Diagnosis not present

## 2014-10-01 DIAGNOSIS — J309 Allergic rhinitis, unspecified: Secondary | ICD-10-CM | POA: Diagnosis not present

## 2014-10-08 DIAGNOSIS — M545 Low back pain: Secondary | ICD-10-CM | POA: Diagnosis not present

## 2014-10-11 DIAGNOSIS — J309 Allergic rhinitis, unspecified: Secondary | ICD-10-CM | POA: Diagnosis not present

## 2014-10-13 DIAGNOSIS — M545 Low back pain: Secondary | ICD-10-CM | POA: Diagnosis not present

## 2014-10-27 DIAGNOSIS — M545 Low back pain: Secondary | ICD-10-CM | POA: Diagnosis not present

## 2014-11-12 DIAGNOSIS — J309 Allergic rhinitis, unspecified: Secondary | ICD-10-CM | POA: Diagnosis not present

## 2014-12-07 DIAGNOSIS — Z0000 Encounter for general adult medical examination without abnormal findings: Secondary | ICD-10-CM | POA: Diagnosis not present

## 2014-12-07 DIAGNOSIS — I10 Essential (primary) hypertension: Secondary | ICD-10-CM | POA: Diagnosis not present

## 2014-12-10 DIAGNOSIS — J309 Allergic rhinitis, unspecified: Secondary | ICD-10-CM | POA: Diagnosis not present

## 2014-12-14 DIAGNOSIS — C61 Malignant neoplasm of prostate: Secondary | ICD-10-CM | POA: Diagnosis not present

## 2014-12-14 DIAGNOSIS — I10 Essential (primary) hypertension: Secondary | ICD-10-CM | POA: Diagnosis not present

## 2014-12-14 DIAGNOSIS — R739 Hyperglycemia, unspecified: Secondary | ICD-10-CM | POA: Diagnosis not present

## 2014-12-14 DIAGNOSIS — E780 Pure hypercholesterolemia: Secondary | ICD-10-CM | POA: Diagnosis not present

## 2014-12-16 DIAGNOSIS — J309 Allergic rhinitis, unspecified: Secondary | ICD-10-CM | POA: Diagnosis not present

## 2014-12-31 DIAGNOSIS — J301 Allergic rhinitis due to pollen: Secondary | ICD-10-CM | POA: Diagnosis not present

## 2014-12-31 DIAGNOSIS — J029 Acute pharyngitis, unspecified: Secondary | ICD-10-CM | POA: Diagnosis not present

## 2015-01-10 DIAGNOSIS — J309 Allergic rhinitis, unspecified: Secondary | ICD-10-CM | POA: Diagnosis not present

## 2015-01-14 DIAGNOSIS — N5201 Erectile dysfunction due to arterial insufficiency: Secondary | ICD-10-CM | POA: Diagnosis not present

## 2015-01-14 DIAGNOSIS — E291 Testicular hypofunction: Secondary | ICD-10-CM | POA: Diagnosis not present

## 2015-01-14 DIAGNOSIS — Z8546 Personal history of malignant neoplasm of prostate: Secondary | ICD-10-CM | POA: Diagnosis not present

## 2015-02-10 DIAGNOSIS — J309 Allergic rhinitis, unspecified: Secondary | ICD-10-CM | POA: Diagnosis not present

## 2015-02-26 DIAGNOSIS — J309 Allergic rhinitis, unspecified: Secondary | ICD-10-CM

## 2015-02-26 DIAGNOSIS — H1010 Acute atopic conjunctivitis, unspecified eye: Secondary | ICD-10-CM | POA: Insufficient documentation

## 2015-02-26 DIAGNOSIS — K219 Gastro-esophageal reflux disease without esophagitis: Secondary | ICD-10-CM | POA: Insufficient documentation

## 2015-03-14 DIAGNOSIS — J309 Allergic rhinitis, unspecified: Secondary | ICD-10-CM | POA: Diagnosis not present

## 2015-03-14 DIAGNOSIS — J301 Allergic rhinitis due to pollen: Secondary | ICD-10-CM | POA: Diagnosis not present

## 2015-03-14 DIAGNOSIS — J029 Acute pharyngitis, unspecified: Secondary | ICD-10-CM | POA: Diagnosis not present

## 2015-03-24 DIAGNOSIS — J209 Acute bronchitis, unspecified: Secondary | ICD-10-CM | POA: Diagnosis not present

## 2015-04-08 DIAGNOSIS — J301 Allergic rhinitis due to pollen: Secondary | ICD-10-CM | POA: Diagnosis not present

## 2015-05-02 ENCOUNTER — Ambulatory Visit (INDEPENDENT_AMBULATORY_CARE_PROVIDER_SITE_OTHER): Payer: Medicare Other | Admitting: *Deleted

## 2015-05-02 DIAGNOSIS — J309 Allergic rhinitis, unspecified: Secondary | ICD-10-CM | POA: Diagnosis not present

## 2015-05-10 ENCOUNTER — Ambulatory Visit (INDEPENDENT_AMBULATORY_CARE_PROVIDER_SITE_OTHER): Payer: Medicare Other | Admitting: *Deleted

## 2015-05-10 DIAGNOSIS — J309 Allergic rhinitis, unspecified: Secondary | ICD-10-CM | POA: Diagnosis not present

## 2015-05-19 ENCOUNTER — Ambulatory Visit (INDEPENDENT_AMBULATORY_CARE_PROVIDER_SITE_OTHER): Payer: Medicare Other

## 2015-05-19 DIAGNOSIS — J309 Allergic rhinitis, unspecified: Secondary | ICD-10-CM

## 2015-05-30 ENCOUNTER — Ambulatory Visit (INDEPENDENT_AMBULATORY_CARE_PROVIDER_SITE_OTHER): Payer: Medicare Other | Admitting: Neurology

## 2015-05-30 DIAGNOSIS — J309 Allergic rhinitis, unspecified: Secondary | ICD-10-CM

## 2015-06-09 ENCOUNTER — Ambulatory Visit (INDEPENDENT_AMBULATORY_CARE_PROVIDER_SITE_OTHER): Payer: Medicare Other

## 2015-06-09 DIAGNOSIS — J309 Allergic rhinitis, unspecified: Secondary | ICD-10-CM | POA: Diagnosis not present

## 2015-06-15 NOTE — Progress Notes (Signed)
This encounter was created in error - please disregard.

## 2015-07-12 ENCOUNTER — Ambulatory Visit (INDEPENDENT_AMBULATORY_CARE_PROVIDER_SITE_OTHER): Payer: Medicare Other | Admitting: *Deleted

## 2015-07-12 DIAGNOSIS — J309 Allergic rhinitis, unspecified: Secondary | ICD-10-CM

## 2015-08-15 ENCOUNTER — Ambulatory Visit (INDEPENDENT_AMBULATORY_CARE_PROVIDER_SITE_OTHER): Payer: Medicare Other

## 2015-08-15 DIAGNOSIS — J309 Allergic rhinitis, unspecified: Secondary | ICD-10-CM | POA: Diagnosis not present

## 2015-09-08 DIAGNOSIS — J3089 Other allergic rhinitis: Secondary | ICD-10-CM | POA: Diagnosis not present

## 2015-09-09 DIAGNOSIS — J301 Allergic rhinitis due to pollen: Secondary | ICD-10-CM | POA: Diagnosis not present

## 2015-09-21 DIAGNOSIS — Z0000 Encounter for general adult medical examination without abnormal findings: Secondary | ICD-10-CM | POA: Diagnosis not present

## 2015-09-21 DIAGNOSIS — I10 Essential (primary) hypertension: Secondary | ICD-10-CM | POA: Diagnosis not present

## 2015-09-21 DIAGNOSIS — C61 Malignant neoplasm of prostate: Secondary | ICD-10-CM | POA: Diagnosis not present

## 2015-09-21 DIAGNOSIS — R739 Hyperglycemia, unspecified: Secondary | ICD-10-CM | POA: Diagnosis not present

## 2015-09-22 ENCOUNTER — Ambulatory Visit (INDEPENDENT_AMBULATORY_CARE_PROVIDER_SITE_OTHER): Payer: Medicare Other

## 2015-09-22 DIAGNOSIS — J309 Allergic rhinitis, unspecified: Secondary | ICD-10-CM

## 2015-09-28 DIAGNOSIS — I10 Essential (primary) hypertension: Secondary | ICD-10-CM | POA: Diagnosis not present

## 2015-09-28 DIAGNOSIS — E7800 Pure hypercholesterolemia, unspecified: Secondary | ICD-10-CM | POA: Diagnosis not present

## 2015-09-28 DIAGNOSIS — R739 Hyperglycemia, unspecified: Secondary | ICD-10-CM | POA: Diagnosis not present

## 2015-10-10 ENCOUNTER — Encounter (INDEPENDENT_AMBULATORY_CARE_PROVIDER_SITE_OTHER): Payer: Medicare Other

## 2015-10-10 NOTE — Progress Notes (Signed)
This encounter was created in error - please disregard.

## 2015-10-28 ENCOUNTER — Ambulatory Visit (INDEPENDENT_AMBULATORY_CARE_PROVIDER_SITE_OTHER): Payer: Medicare Other | Admitting: *Deleted

## 2015-10-28 DIAGNOSIS — J309 Allergic rhinitis, unspecified: Secondary | ICD-10-CM

## 2015-11-03 DIAGNOSIS — R197 Diarrhea, unspecified: Secondary | ICD-10-CM | POA: Diagnosis not present

## 2015-11-11 DIAGNOSIS — Z0000 Encounter for general adult medical examination without abnormal findings: Secondary | ICD-10-CM | POA: Diagnosis not present

## 2015-11-11 DIAGNOSIS — Z8546 Personal history of malignant neoplasm of prostate: Secondary | ICD-10-CM | POA: Diagnosis not present

## 2015-11-11 DIAGNOSIS — E291 Testicular hypofunction: Secondary | ICD-10-CM | POA: Diagnosis not present

## 2016-03-16 DIAGNOSIS — J0110 Acute frontal sinusitis, unspecified: Secondary | ICD-10-CM | POA: Diagnosis not present

## 2016-03-16 DIAGNOSIS — Z23 Encounter for immunization: Secondary | ICD-10-CM | POA: Diagnosis not present

## 2016-03-22 DIAGNOSIS — J029 Acute pharyngitis, unspecified: Secondary | ICD-10-CM | POA: Diagnosis not present

## 2016-04-12 DIAGNOSIS — R739 Hyperglycemia, unspecified: Secondary | ICD-10-CM | POA: Diagnosis not present

## 2016-04-12 DIAGNOSIS — N401 Enlarged prostate with lower urinary tract symptoms: Secondary | ICD-10-CM | POA: Diagnosis not present

## 2016-04-12 DIAGNOSIS — I10 Essential (primary) hypertension: Secondary | ICD-10-CM | POA: Diagnosis not present

## 2016-04-12 DIAGNOSIS — R3915 Urgency of urination: Secondary | ICD-10-CM | POA: Diagnosis not present

## 2016-04-19 ENCOUNTER — Other Ambulatory Visit: Payer: Self-pay

## 2016-04-19 ENCOUNTER — Telehealth: Payer: Self-pay | Admitting: *Deleted

## 2016-04-19 ENCOUNTER — Ambulatory Visit: Payer: Self-pay | Admitting: *Deleted

## 2016-04-19 DIAGNOSIS — R739 Hyperglycemia, unspecified: Secondary | ICD-10-CM | POA: Diagnosis not present

## 2016-04-19 DIAGNOSIS — K649 Unspecified hemorrhoids: Secondary | ICD-10-CM | POA: Diagnosis not present

## 2016-04-19 DIAGNOSIS — E7800 Pure hypercholesterolemia, unspecified: Secondary | ICD-10-CM | POA: Diagnosis not present

## 2016-04-19 DIAGNOSIS — I10 Essential (primary) hypertension: Secondary | ICD-10-CM | POA: Diagnosis not present

## 2016-04-19 NOTE — Telephone Encounter (Signed)
Patient has not had an injection since May/2017. He was suppose to start a new vial with is next injection. Where do you want to restart?

## 2016-04-19 NOTE — Telephone Encounter (Signed)
Can restart at 0.05 ML's red vial.

## 2016-04-24 ENCOUNTER — Ambulatory Visit (INDEPENDENT_AMBULATORY_CARE_PROVIDER_SITE_OTHER): Payer: Medicare Other

## 2016-04-24 DIAGNOSIS — J309 Allergic rhinitis, unspecified: Secondary | ICD-10-CM | POA: Diagnosis not present

## 2016-04-24 NOTE — Telephone Encounter (Signed)
Patient advised of instructions. 

## 2016-05-01 ENCOUNTER — Ambulatory Visit: Payer: Self-pay | Admitting: *Deleted

## 2016-05-01 ENCOUNTER — Encounter (INDEPENDENT_AMBULATORY_CARE_PROVIDER_SITE_OTHER): Payer: Self-pay

## 2016-05-01 ENCOUNTER — Encounter: Payer: Self-pay | Admitting: Allergy and Immunology

## 2016-05-01 ENCOUNTER — Ambulatory Visit (INDEPENDENT_AMBULATORY_CARE_PROVIDER_SITE_OTHER): Payer: Medicare Other | Admitting: Allergy and Immunology

## 2016-05-01 VITALS — BP 138/80 | HR 72 | Resp 16

## 2016-05-01 DIAGNOSIS — K219 Gastro-esophageal reflux disease without esophagitis: Secondary | ICD-10-CM | POA: Diagnosis not present

## 2016-05-01 DIAGNOSIS — J4530 Mild persistent asthma, uncomplicated: Secondary | ICD-10-CM

## 2016-05-01 DIAGNOSIS — J309 Allergic rhinitis, unspecified: Secondary | ICD-10-CM

## 2016-05-01 DIAGNOSIS — J3089 Other allergic rhinitis: Secondary | ICD-10-CM | POA: Diagnosis not present

## 2016-05-01 MED ORDER — MONTELUKAST SODIUM 10 MG PO TABS: 10.0000 mg | ORAL_TABLET | Freq: Every day | ORAL | 5 refills | Status: AC

## 2016-05-01 MED ORDER — EPINEPHRINE 0.3 MG/0.3ML IJ SOAJ: 0.3000 mg | Freq: Once | INTRAMUSCULAR | 1 refills | Status: AC

## 2016-05-01 MED ORDER — RANITIDINE HCL 300 MG PO TABS: 300.0000 mg | ORAL_TABLET | Freq: Every day | ORAL | 5 refills | Status: DC

## 2016-05-01 NOTE — Patient Instructions (Signed)
  1. Continue Flonase one spray each nostril twice a day  2. Continue Asmanex 220 - one inhalation 1 time per day  3. Start montelukast 10 mg tablet one tablet one time per day  4. Restart immunotherapy and EpiPen  5. Treat reflux with a combination of Protonix 40 mg twice a day plus ranitidine 300 mg in the evening  6. If needed:   A. Proventil HFA 2 puffs every 4-6 hours  B. nasal saline  C. OTC antihistamine  7. Obtain fall flu vaccine  8. Return to clinic in 6 months or earlier if problem

## 2016-05-01 NOTE — Progress Notes (Signed)
Follow-up Note  Referring Provider: Jani Gravel, MD Primary Provider: Jani Gravel, MD Date of Office Visit: 05/01/2016  Subjective:   James Acevedo (DOB: Feb 12, 1941) is a 75 y.o. male who returns to the [facility] on 05/01/2016 in re-evaluation of the following:  HPI: James Acevedo returns to this clinic in reevaluation of his asthma and allergic rhinoconjunctivitis and reflux. I've not seen him in his clinic since August 2015.  His asthma has been under excellent control using Asmanex 1 inhalation 1 time per day. He only uses his bronchodilator prior to the performance of exercise such as when he plays tennis or walking. He has not required a systemic steroid to treat an exacerbation of asthma.  He discontinued his immunotherapy over the course the past 6 months and he has developed nasal congestion and sneezing and clear rhinorrhea even while using his Flonase the past several months. He has no associated anosmia or decreased ability to taste or headache. He restarted his immunotherapy today. He does use nasal saline on a regular basis.  His reflux has been active. He has some sternal chest pain and burning and regurgitation even while using Protonix twice a day. He has tried to modify his eating by decreasing the amount that he eats at nighttime and using baking soda every night. He has eliminated the use of all caffeine and chocolate and only drinks socially.    Medication List      ASMANEX 60 METERED DOSES 220 MCG/INH inhaler Generic drug:  mometasone Inhale 1 puff into the lungs 2 (two) times daily.   bisoprolol-hydrochlorothiazide 5-6.25 MG tablet Commonly known as:  ZIAC Take 1 tablet by mouth daily.   buPROPion 150 MG 12 hr tablet Commonly known as:  ZYBAN Take 150 mg by mouth every evening.   cholecalciferol 1000 units tablet Commonly known as:  VITAMIN D Take 1,000 Units by mouth daily.   clopidogrel 75 MG tablet Commonly known as:  PLAVIX take 1 tablet  by mouth once daily   EPINEPHrine 0.3 mg/0.3 mL Soaj injection Commonly known as:  EPI-PEN Inject 0.3 mLs (0.3 mg total) into the muscle once. As needed for life-threatening allergic reactions   fluticasone 50 MCG/ACT nasal spray Commonly known as:  FLONASE Place 1 spray into both nostrils daily.   loratadine 10 MG tablet Commonly known as:  CLARITIN Take 10 mg by mouth every morning.   montelukast 10 MG tablet Commonly known as:  SINGULAIR Take 1 tablet (10 mg total) by mouth at bedtime.   multivitamin tablet Take 1 tablet by mouth daily.   pantoprazole 40 MG tablet Commonly known as:  PROTONIX Take 40 mg by mouth 2 (two) times daily.   pravastatin 20 MG tablet Commonly known as:  PRAVACHOL Take 20 mg by mouth every evening.   PROVENTIL HFA 108 (90 Base) MCG/ACT inhaler Generic drug:  albuterol Inhale 2 puffs into the lungs every 4 (four) hours as needed for wheezing or shortness of breath.   ranitidine 300 MG tablet Commonly known as:  ZANTAC Take 1 tablet (300 mg total) by mouth at bedtime.   solifenacin 5 MG tablet Commonly known as:  VESICARE Take 5 mg by mouth daily.   tamsulosin 0.4 MG Caps capsule Commonly known as:  FLOMAX Take 0.4 mg by mouth daily after supper.   valsartan 320 MG tablet Commonly known as:  DIOVAN Take 320 mg by mouth every evening.       Past Medical History:  Diagnosis Date  .  Agent orange exposure   . Asthma   . Cancer ([place])   . Depression   . GERD (gastroesophageal reflux disease)   . History of TIA (transient ischemic attack) OCTOBER 2012--  NO RESIDUAL   NEURO CONSULT IN FEB 2013  . Hypertension   . Prostate cancer ([place]) 04/12/10   gleason 3+3=6, volume 40 cc  . Sinus abscess     Past Surgical History:  Procedure Laterality Date  . CYSTOSCOPY  11/26/2011   Procedure: CYSTOSCOPY;  Surgeon: Bernestine Amass, MD;  Location: [facility];  Service: Urology;  Laterality: N/A;  NO SEEDS FOUND IN BLADDER  .  EXICISON LIPOMA     BACK  IN 2006  AND HEAD  IN 2009  . LEFT WRIST RECONSTRUCTION  2000  . PROSTATE BIOPSY  08/02/11   gleason 3+3=6, volume 35 cc  . RADIOACTIVE SEED IMPLANT  11/26/2011   Procedure: RADIOACTIVE SEED IMPLANT;  Surgeon: Bernestine Amass, MD;  Location: [facility];  Service: Urology;  Laterality: N/A;   70 SEEDS IMPLANTED  . TONSILLECTOMY  AGE 3    No Known Allergies  Review of systems negative except as noted in HPI / PMHx or noted below:  Review of Systems  Constitutional: Negative.   HENT: Negative.   Eyes: Negative.   Respiratory: Negative.   Cardiovascular: Negative.   Gastrointestinal: Negative.   Genitourinary: Negative.   Musculoskeletal: Negative.   Skin: Negative.   Neurological: Negative.   Endo/Heme/Allergies: Negative.   Psychiatric/Behavioral: Negative.      Objective:   Vitals:   05/01/16 1052  BP: 138/80  Pulse: 72  Resp: 16          Physical Exam  Constitutional: He is well-developed, well-nourished, and in no distress.  HENT:  Head: Normocephalic.  Right Ear: Tympanic membrane, external ear and ear canal normal.  Left Ear: Tympanic membrane, external ear and ear canal normal.  Nose: Mucosal edema present. No rhinorrhea.  Mouth/Throat: Uvula is midline, oropharynx is clear and moist and mucous membranes are normal. No oropharyngeal exudate.  Eyes: Conjunctivae are normal.  Neck: Trachea normal. No tracheal tenderness present. No tracheal deviation present. No thyromegaly present.  Cardiovascular: Normal rate, regular rhythm, S1 normal, S2 normal and normal heart sounds.   No murmur heard. Pulmonary/Chest: Breath sounds normal. No stridor. No respiratory distress. He has no wheezes. He has no rales.  Musculoskeletal: He exhibits no edema.  Lymphadenopathy:       Head (right side): No tonsillar adenopathy present.       Head (left side): No tonsillar adenopathy present.    He has no cervical adenopathy.    Neurological: He is alert. Gait normal.  Skin: No rash noted. He is not diaphoretic. No erythema. Nails show no clubbing.  Psychiatric: Mood and affect normal.    Diagnostics:    Spirometry was performed and demonstrated an FEV1 of 2.17 at 72 % of predicted.  Assessment and Plan:   1. Other allergic rhinitis   2. Asthma, well controlled, mild persistent   3. Gastroesophageal reflux disease, esophagitis presence not specified     1. Continue Flonase one spray each nostril twice a day  2. Continue Asmanex 220 - one inhalation 1 time per day  3. Start montelukast 10 mg tablet one tablet one time per day  4. Restart immunotherapy and EpiPen  5. Treat reflux with a combination of Protonix 40 mg twice a day plus ranitidine 300 mg in the evening  6. If needed:   A. Proventil HFA 2 puffs every 4-6 hours  B. nasal saline  C. OTC antihistamine  7. Obtain fall flu vaccine  8. Return to clinic in 6 months or earlier if problem  I will have James Acevedo utilize the therapy mentioned above including the addition of montelukast to his anti-inflammatory agents for his respiratory tract and hopefully with restarting his immunotherapy and we will get his atopic respiratory tract disease under better control. If he doesn't do well we may need to give him a systemic steroid. I've also recommended that he add in ranitidine to his Protonix to help with his reflux issue. He'll keep in contact with me noting his response to this approach. If he does well I'll see him back in this clinic in 6 months or earlier if there is a problem.  Allena Katz, MD [place]

## 2016-05-04 ENCOUNTER — Encounter: Payer: Self-pay | Admitting: Allergy and Immunology

## 2016-05-04 ENCOUNTER — Telehealth: Payer: Self-pay | Admitting: Allergy and Immunology

## 2016-05-04 NOTE — Telephone Encounter (Signed)
Patient saw Dr. Neldon Mc on 05-01-16. He said his allergies are still bothering him and was told to call if he didn't get better.

## 2016-05-04 NOTE — Telephone Encounter (Signed)
Please advise 

## 2016-05-07 NOTE — Telephone Encounter (Signed)
Informed patient of Prednisone. Place Prednisone up front for patient to pick up.

## 2016-05-07 NOTE — Telephone Encounter (Signed)
Please have patient use prednisone 10 mg a day for 10 days only

## 2016-05-21 DIAGNOSIS — E291 Testicular hypofunction: Secondary | ICD-10-CM | POA: Diagnosis not present

## 2016-05-21 DIAGNOSIS — R3915 Urgency of urination: Secondary | ICD-10-CM | POA: Diagnosis not present

## 2016-05-31 ENCOUNTER — Ambulatory Visit (INDEPENDENT_AMBULATORY_CARE_PROVIDER_SITE_OTHER): Payer: Medicare Other

## 2016-05-31 DIAGNOSIS — J309 Allergic rhinitis, unspecified: Secondary | ICD-10-CM

## 2016-06-07 ENCOUNTER — Ambulatory Visit (INDEPENDENT_AMBULATORY_CARE_PROVIDER_SITE_OTHER): Payer: Medicare Other | Admitting: *Deleted

## 2016-06-07 DIAGNOSIS — J309 Allergic rhinitis, unspecified: Secondary | ICD-10-CM | POA: Diagnosis not present

## 2016-07-18 ENCOUNTER — Ambulatory Visit (INDEPENDENT_AMBULATORY_CARE_PROVIDER_SITE_OTHER): Payer: Medicare Other

## 2016-07-18 DIAGNOSIS — J309 Allergic rhinitis, unspecified: Secondary | ICD-10-CM | POA: Diagnosis not present

## 2016-07-24 ENCOUNTER — Ambulatory Visit: Payer: Self-pay | Admitting: *Deleted

## 2016-07-25 DIAGNOSIS — J3089 Other allergic rhinitis: Secondary | ICD-10-CM | POA: Diagnosis not present

## 2016-07-26 DIAGNOSIS — J301 Allergic rhinitis due to pollen: Secondary | ICD-10-CM | POA: Diagnosis not present

## 2016-08-02 ENCOUNTER — Ambulatory Visit (INDEPENDENT_AMBULATORY_CARE_PROVIDER_SITE_OTHER): Payer: Medicare Other

## 2016-08-02 DIAGNOSIS — J309 Allergic rhinitis, unspecified: Secondary | ICD-10-CM | POA: Diagnosis not present

## 2016-08-21 NOTE — Addendum Note (Signed)
Addended by: Felipa Emory on: 08/21/2016 11:22 AM   Modules accepted: Orders

## 2016-08-23 ENCOUNTER — Ambulatory Visit (INDEPENDENT_AMBULATORY_CARE_PROVIDER_SITE_OTHER): Payer: Medicare Other | Admitting: *Deleted

## 2016-08-23 DIAGNOSIS — J309 Allergic rhinitis, unspecified: Secondary | ICD-10-CM

## 2016-09-13 ENCOUNTER — Ambulatory Visit (INDEPENDENT_AMBULATORY_CARE_PROVIDER_SITE_OTHER): Payer: Medicare Other | Admitting: *Deleted

## 2016-09-13 DIAGNOSIS — J309 Allergic rhinitis, unspecified: Secondary | ICD-10-CM | POA: Diagnosis not present

## 2016-09-18 DIAGNOSIS — J309 Allergic rhinitis, unspecified: Secondary | ICD-10-CM | POA: Diagnosis not present

## 2016-09-20 ENCOUNTER — Ambulatory Visit (INDEPENDENT_AMBULATORY_CARE_PROVIDER_SITE_OTHER): Payer: Medicare Other | Admitting: *Deleted

## 2016-09-20 DIAGNOSIS — J309 Allergic rhinitis, unspecified: Secondary | ICD-10-CM | POA: Diagnosis not present

## 2016-09-25 ENCOUNTER — Ambulatory Visit (INDEPENDENT_AMBULATORY_CARE_PROVIDER_SITE_OTHER): Payer: Medicare Other | Admitting: *Deleted

## 2016-09-25 DIAGNOSIS — J309 Allergic rhinitis, unspecified: Secondary | ICD-10-CM

## 2016-10-29 DIAGNOSIS — R739 Hyperglycemia, unspecified: Secondary | ICD-10-CM | POA: Diagnosis not present

## 2016-10-29 DIAGNOSIS — I10 Essential (primary) hypertension: Secondary | ICD-10-CM | POA: Diagnosis not present

## 2016-10-29 DIAGNOSIS — E291 Testicular hypofunction: Secondary | ICD-10-CM | POA: Diagnosis not present

## 2016-11-05 DIAGNOSIS — G629 Polyneuropathy, unspecified: Secondary | ICD-10-CM | POA: Diagnosis not present

## 2016-11-05 DIAGNOSIS — Z0000 Encounter for general adult medical examination without abnormal findings: Secondary | ICD-10-CM | POA: Diagnosis not present

## 2016-11-05 DIAGNOSIS — R739 Hyperglycemia, unspecified: Secondary | ICD-10-CM | POA: Diagnosis not present

## 2016-11-21 ENCOUNTER — Ambulatory Visit (INDEPENDENT_AMBULATORY_CARE_PROVIDER_SITE_OTHER): Payer: Medicare Other | Admitting: *Deleted

## 2016-11-21 DIAGNOSIS — J309 Allergic rhinitis, unspecified: Secondary | ICD-10-CM

## 2016-11-30 ENCOUNTER — Ambulatory Visit (INDEPENDENT_AMBULATORY_CARE_PROVIDER_SITE_OTHER): Payer: Medicare Other

## 2016-11-30 DIAGNOSIS — J309 Allergic rhinitis, unspecified: Secondary | ICD-10-CM

## 2017-03-04 DIAGNOSIS — R739 Hyperglycemia, unspecified: Secondary | ICD-10-CM | POA: Diagnosis not present

## 2017-04-05 DIAGNOSIS — Z125 Encounter for screening for malignant neoplasm of prostate: Secondary | ICD-10-CM | POA: Diagnosis not present

## 2017-04-05 DIAGNOSIS — J301 Allergic rhinitis due to pollen: Secondary | ICD-10-CM | POA: Diagnosis not present

## 2017-04-05 DIAGNOSIS — E7800 Pure hypercholesterolemia, unspecified: Secondary | ICD-10-CM | POA: Diagnosis not present

## 2017-04-05 DIAGNOSIS — R739 Hyperglycemia, unspecified: Secondary | ICD-10-CM | POA: Diagnosis not present

## 2017-04-05 DIAGNOSIS — I10 Essential (primary) hypertension: Secondary | ICD-10-CM | POA: Diagnosis not present

## 2017-04-15 DIAGNOSIS — J301 Allergic rhinitis due to pollen: Secondary | ICD-10-CM | POA: Diagnosis not present

## 2017-04-15 DIAGNOSIS — Z23 Encounter for immunization: Secondary | ICD-10-CM | POA: Diagnosis not present

## 2017-07-08 DIAGNOSIS — J029 Acute pharyngitis, unspecified: Secondary | ICD-10-CM | POA: Diagnosis not present

## 2017-09-02 DIAGNOSIS — R3915 Urgency of urination: Secondary | ICD-10-CM | POA: Diagnosis not present

## 2017-09-02 DIAGNOSIS — Z8546 Personal history of malignant neoplasm of prostate: Secondary | ICD-10-CM | POA: Diagnosis not present

## 2017-09-02 DIAGNOSIS — N401 Enlarged prostate with lower urinary tract symptoms: Secondary | ICD-10-CM | POA: Diagnosis not present

## 2017-09-02 DIAGNOSIS — N5201 Erectile dysfunction due to arterial insufficiency: Secondary | ICD-10-CM | POA: Diagnosis not present

## 2017-10-16 DIAGNOSIS — K219 Gastro-esophageal reflux disease without esophagitis: Secondary | ICD-10-CM | POA: Diagnosis not present

## 2017-10-16 DIAGNOSIS — R739 Hyperglycemia, unspecified: Secondary | ICD-10-CM | POA: Diagnosis not present

## 2017-10-16 DIAGNOSIS — I10 Essential (primary) hypertension: Secondary | ICD-10-CM | POA: Diagnosis not present

## 2017-10-16 DIAGNOSIS — N529 Male erectile dysfunction, unspecified: Secondary | ICD-10-CM | POA: Diagnosis not present

## 2017-10-16 DIAGNOSIS — J029 Acute pharyngitis, unspecified: Secondary | ICD-10-CM | POA: Diagnosis not present

## 2017-11-05 DIAGNOSIS — Z79899 Other long term (current) drug therapy: Secondary | ICD-10-CM | POA: Diagnosis not present

## 2017-11-05 DIAGNOSIS — H1010 Acute atopic conjunctivitis, unspecified eye: Secondary | ICD-10-CM | POA: Diagnosis not present

## 2017-11-05 DIAGNOSIS — E7800 Pure hypercholesterolemia, unspecified: Secondary | ICD-10-CM | POA: Diagnosis not present

## 2017-11-05 DIAGNOSIS — I10 Essential (primary) hypertension: Secondary | ICD-10-CM | POA: Diagnosis not present

## 2018-01-13 DIAGNOSIS — E349 Endocrine disorder, unspecified: Secondary | ICD-10-CM | POA: Diagnosis not present

## 2018-01-13 DIAGNOSIS — R739 Hyperglycemia, unspecified: Secondary | ICD-10-CM | POA: Diagnosis not present

## 2018-01-13 DIAGNOSIS — Z125 Encounter for screening for malignant neoplasm of prostate: Secondary | ICD-10-CM | POA: Diagnosis not present

## 2018-01-13 DIAGNOSIS — N5201 Erectile dysfunction due to arterial insufficiency: Secondary | ICD-10-CM | POA: Diagnosis not present

## 2018-01-13 DIAGNOSIS — I10 Essential (primary) hypertension: Secondary | ICD-10-CM | POA: Diagnosis not present

## 2018-01-13 DIAGNOSIS — Z8546 Personal history of malignant neoplasm of prostate: Secondary | ICD-10-CM | POA: Diagnosis not present

## 2018-01-20 DIAGNOSIS — Z0000 Encounter for general adult medical examination without abnormal findings: Secondary | ICD-10-CM | POA: Diagnosis not present

## 2018-01-20 DIAGNOSIS — R739 Hyperglycemia, unspecified: Secondary | ICD-10-CM | POA: Diagnosis not present

## 2018-01-20 DIAGNOSIS — E291 Testicular hypofunction: Secondary | ICD-10-CM | POA: Diagnosis not present

## 2018-01-20 DIAGNOSIS — I10 Essential (primary) hypertension: Secondary | ICD-10-CM | POA: Diagnosis not present

## 2018-01-20 DIAGNOSIS — E7800 Pure hypercholesterolemia, unspecified: Secondary | ICD-10-CM | POA: Diagnosis not present

## 2018-01-22 DIAGNOSIS — Z8546 Personal history of malignant neoplasm of prostate: Secondary | ICD-10-CM | POA: Diagnosis not present

## 2018-01-22 DIAGNOSIS — E349 Endocrine disorder, unspecified: Secondary | ICD-10-CM | POA: Diagnosis not present

## 2018-02-04 DIAGNOSIS — Z8546 Personal history of malignant neoplasm of prostate: Secondary | ICD-10-CM | POA: Diagnosis not present

## 2018-02-04 DIAGNOSIS — E291 Testicular hypofunction: Secondary | ICD-10-CM | POA: Diagnosis not present

## 2018-02-05 DIAGNOSIS — E291 Testicular hypofunction: Secondary | ICD-10-CM | POA: Diagnosis not present

## 2018-02-25 DIAGNOSIS — J029 Acute pharyngitis, unspecified: Secondary | ICD-10-CM | POA: Diagnosis not present

## 2018-02-25 DIAGNOSIS — I10 Essential (primary) hypertension: Secondary | ICD-10-CM | POA: Diagnosis not present

## 2018-02-25 DIAGNOSIS — J301 Allergic rhinitis due to pollen: Secondary | ICD-10-CM | POA: Diagnosis not present

## 2018-02-25 DIAGNOSIS — R0981 Nasal congestion: Secondary | ICD-10-CM | POA: Diagnosis not present

## 2018-02-25 DIAGNOSIS — I519 Heart disease, unspecified: Secondary | ICD-10-CM | POA: Diagnosis not present

## 2018-02-25 DIAGNOSIS — K219 Gastro-esophageal reflux disease without esophagitis: Secondary | ICD-10-CM | POA: Diagnosis not present

## 2018-05-01 DIAGNOSIS — J029 Acute pharyngitis, unspecified: Secondary | ICD-10-CM | POA: Diagnosis not present

## 2018-05-01 DIAGNOSIS — J0110 Acute frontal sinusitis, unspecified: Secondary | ICD-10-CM | POA: Diagnosis not present

## 2018-05-01 DIAGNOSIS — I10 Essential (primary) hypertension: Secondary | ICD-10-CM | POA: Diagnosis not present

## 2018-05-01 DIAGNOSIS — J301 Allergic rhinitis due to pollen: Secondary | ICD-10-CM | POA: Diagnosis not present

## 2018-05-08 DIAGNOSIS — Z8546 Personal history of malignant neoplasm of prostate: Secondary | ICD-10-CM | POA: Diagnosis not present

## 2018-05-08 DIAGNOSIS — E291 Testicular hypofunction: Secondary | ICD-10-CM | POA: Diagnosis not present

## 2018-05-23 DIAGNOSIS — Z8546 Personal history of malignant neoplasm of prostate: Secondary | ICD-10-CM | POA: Diagnosis not present

## 2018-05-23 DIAGNOSIS — R3915 Urgency of urination: Secondary | ICD-10-CM | POA: Diagnosis not present

## 2018-05-23 DIAGNOSIS — E291 Testicular hypofunction: Secondary | ICD-10-CM | POA: Diagnosis not present

## 2018-07-16 DIAGNOSIS — R739 Hyperglycemia, unspecified: Secondary | ICD-10-CM | POA: Diagnosis not present

## 2018-07-16 DIAGNOSIS — I10 Essential (primary) hypertension: Secondary | ICD-10-CM | POA: Diagnosis not present

## 2018-07-23 DIAGNOSIS — R739 Hyperglycemia, unspecified: Secondary | ICD-10-CM | POA: Diagnosis not present

## 2018-07-23 DIAGNOSIS — I519 Heart disease, unspecified: Secondary | ICD-10-CM | POA: Diagnosis not present

## 2018-07-23 DIAGNOSIS — E039 Hypothyroidism, unspecified: Secondary | ICD-10-CM | POA: Diagnosis not present

## 2018-07-23 DIAGNOSIS — K219 Gastro-esophageal reflux disease without esophagitis: Secondary | ICD-10-CM | POA: Diagnosis not present

## 2018-07-23 DIAGNOSIS — I10 Essential (primary) hypertension: Secondary | ICD-10-CM | POA: Diagnosis not present

## 2018-08-15 DIAGNOSIS — I10 Essential (primary) hypertension: Secondary | ICD-10-CM | POA: Diagnosis not present

## 2018-08-15 DIAGNOSIS — J301 Allergic rhinitis due to pollen: Secondary | ICD-10-CM | POA: Diagnosis not present

## 2018-08-15 DIAGNOSIS — J0110 Acute frontal sinusitis, unspecified: Secondary | ICD-10-CM | POA: Diagnosis not present

## 2018-11-25 DIAGNOSIS — Z8546 Personal history of malignant neoplasm of prostate: Secondary | ICD-10-CM | POA: Diagnosis not present

## 2018-11-25 DIAGNOSIS — E291 Testicular hypofunction: Secondary | ICD-10-CM | POA: Diagnosis not present

## 2018-12-02 DIAGNOSIS — Z8546 Personal history of malignant neoplasm of prostate: Secondary | ICD-10-CM | POA: Diagnosis not present

## 2018-12-02 DIAGNOSIS — N5201 Erectile dysfunction due to arterial insufficiency: Secondary | ICD-10-CM | POA: Diagnosis not present

## 2018-12-02 DIAGNOSIS — E291 Testicular hypofunction: Secondary | ICD-10-CM | POA: Diagnosis not present

## 2018-12-17 DIAGNOSIS — I10 Essential (primary) hypertension: Secondary | ICD-10-CM | POA: Diagnosis not present

## 2018-12-17 DIAGNOSIS — J301 Allergic rhinitis due to pollen: Secondary | ICD-10-CM | POA: Diagnosis not present

## 2018-12-17 DIAGNOSIS — J0110 Acute frontal sinusitis, unspecified: Secondary | ICD-10-CM | POA: Diagnosis not present

## 2019-01-21 DIAGNOSIS — I10 Essential (primary) hypertension: Secondary | ICD-10-CM | POA: Diagnosis not present

## 2019-01-21 DIAGNOSIS — Z125 Encounter for screening for malignant neoplasm of prostate: Secondary | ICD-10-CM | POA: Diagnosis not present

## 2019-01-21 DIAGNOSIS — R739 Hyperglycemia, unspecified: Secondary | ICD-10-CM | POA: Diagnosis not present

## 2019-01-21 DIAGNOSIS — Z0000 Encounter for general adult medical examination without abnormal findings: Secondary | ICD-10-CM | POA: Diagnosis not present

## 2019-01-21 DIAGNOSIS — E785 Hyperlipidemia, unspecified: Secondary | ICD-10-CM | POA: Diagnosis not present

## 2019-01-28 DIAGNOSIS — Z0000 Encounter for general adult medical examination without abnormal findings: Secondary | ICD-10-CM | POA: Diagnosis not present

## 2019-01-28 DIAGNOSIS — I10 Essential (primary) hypertension: Secondary | ICD-10-CM | POA: Diagnosis not present

## 2019-01-28 DIAGNOSIS — C61 Malignant neoplasm of prostate: Secondary | ICD-10-CM | POA: Diagnosis not present

## 2019-01-28 DIAGNOSIS — E7800 Pure hypercholesterolemia, unspecified: Secondary | ICD-10-CM | POA: Diagnosis not present

## 2019-01-28 DIAGNOSIS — F4310 Post-traumatic stress disorder, unspecified: Secondary | ICD-10-CM | POA: Diagnosis not present

## 2019-01-28 DIAGNOSIS — R7303 Prediabetes: Secondary | ICD-10-CM | POA: Diagnosis not present

## 2019-02-20 DIAGNOSIS — J0100 Acute maxillary sinusitis, unspecified: Secondary | ICD-10-CM | POA: Diagnosis not present

## 2019-02-20 DIAGNOSIS — J309 Allergic rhinitis, unspecified: Secondary | ICD-10-CM | POA: Diagnosis not present

## 2019-02-20 DIAGNOSIS — I10 Essential (primary) hypertension: Secondary | ICD-10-CM | POA: Diagnosis not present

## 2019-05-22 ENCOUNTER — Other Ambulatory Visit: Payer: Self-pay

## 2019-05-22 DIAGNOSIS — Z20822 Contact with and (suspected) exposure to covid-19: Secondary | ICD-10-CM

## 2019-05-23 LAB — NOVEL CORONAVIRUS, NAA: SARS-CoV-2, NAA: NOT DETECTED

## 2019-05-26 DIAGNOSIS — Z8546 Personal history of malignant neoplasm of prostate: Secondary | ICD-10-CM | POA: Diagnosis not present

## 2019-05-26 DIAGNOSIS — E291 Testicular hypofunction: Secondary | ICD-10-CM | POA: Diagnosis not present

## 2019-06-02 DIAGNOSIS — N5201 Erectile dysfunction due to arterial insufficiency: Secondary | ICD-10-CM | POA: Diagnosis not present

## 2019-06-02 DIAGNOSIS — E291 Testicular hypofunction: Secondary | ICD-10-CM | POA: Diagnosis not present

## 2019-06-02 DIAGNOSIS — Z8546 Personal history of malignant neoplasm of prostate: Secondary | ICD-10-CM | POA: Diagnosis not present

## 2019-06-23 DIAGNOSIS — J019 Acute sinusitis, unspecified: Secondary | ICD-10-CM | POA: Diagnosis not present

## 2019-08-03 DIAGNOSIS — R739 Hyperglycemia, unspecified: Secondary | ICD-10-CM | POA: Diagnosis not present

## 2019-08-03 DIAGNOSIS — E7800 Pure hypercholesterolemia, unspecified: Secondary | ICD-10-CM | POA: Diagnosis not present

## 2019-08-03 DIAGNOSIS — Z0000 Encounter for general adult medical examination without abnormal findings: Secondary | ICD-10-CM | POA: Diagnosis not present

## 2019-08-03 DIAGNOSIS — I10 Essential (primary) hypertension: Secondary | ICD-10-CM | POA: Diagnosis not present

## 2019-08-03 DIAGNOSIS — Z125 Encounter for screening for malignant neoplasm of prostate: Secondary | ICD-10-CM | POA: Diagnosis not present

## 2019-08-18 DIAGNOSIS — N181 Chronic kidney disease, stage 1: Secondary | ICD-10-CM | POA: Diagnosis not present

## 2019-08-18 DIAGNOSIS — K219 Gastro-esophageal reflux disease without esophagitis: Secondary | ICD-10-CM | POA: Diagnosis not present

## 2019-08-18 DIAGNOSIS — I10 Essential (primary) hypertension: Secondary | ICD-10-CM | POA: Diagnosis not present

## 2019-08-18 DIAGNOSIS — F4310 Post-traumatic stress disorder, unspecified: Secondary | ICD-10-CM | POA: Diagnosis not present

## 2019-08-18 DIAGNOSIS — R350 Frequency of micturition: Secondary | ICD-10-CM | POA: Diagnosis not present

## 2019-08-18 DIAGNOSIS — R739 Hyperglycemia, unspecified: Secondary | ICD-10-CM | POA: Diagnosis not present

## 2019-08-18 DIAGNOSIS — E039 Hypothyroidism, unspecified: Secondary | ICD-10-CM | POA: Diagnosis not present

## 2019-08-18 DIAGNOSIS — E785 Hyperlipidemia, unspecified: Secondary | ICD-10-CM | POA: Diagnosis not present

## 2019-08-18 DIAGNOSIS — E291 Testicular hypofunction: Secondary | ICD-10-CM | POA: Diagnosis not present

## 2019-11-24 DIAGNOSIS — E291 Testicular hypofunction: Secondary | ICD-10-CM | POA: Diagnosis not present

## 2019-11-24 DIAGNOSIS — Z8546 Personal history of malignant neoplasm of prostate: Secondary | ICD-10-CM | POA: Diagnosis not present

## 2019-12-01 DIAGNOSIS — Z8546 Personal history of malignant neoplasm of prostate: Secondary | ICD-10-CM | POA: Diagnosis not present

## 2019-12-01 DIAGNOSIS — E291 Testicular hypofunction: Secondary | ICD-10-CM | POA: Diagnosis not present

## 2020-01-12 ENCOUNTER — Other Ambulatory Visit: Payer: Self-pay

## 2020-01-15 DIAGNOSIS — J0181 Other acute recurrent sinusitis: Secondary | ICD-10-CM | POA: Diagnosis not present

## 2020-03-29 DIAGNOSIS — E7800 Pure hypercholesterolemia, unspecified: Secondary | ICD-10-CM | POA: Diagnosis not present

## 2020-03-29 DIAGNOSIS — Z0000 Encounter for general adult medical examination without abnormal findings: Secondary | ICD-10-CM | POA: Diagnosis not present

## 2020-03-29 DIAGNOSIS — I10 Essential (primary) hypertension: Secondary | ICD-10-CM | POA: Diagnosis not present

## 2020-03-29 DIAGNOSIS — R739 Hyperglycemia, unspecified: Secondary | ICD-10-CM | POA: Diagnosis not present

## 2020-03-29 DIAGNOSIS — R7989 Other specified abnormal findings of blood chemistry: Secondary | ICD-10-CM | POA: Diagnosis not present

## 2020-03-29 DIAGNOSIS — Z125 Encounter for screening for malignant neoplasm of prostate: Secondary | ICD-10-CM | POA: Diagnosis not present

## 2020-04-05 DIAGNOSIS — R634 Abnormal weight loss: Secondary | ICD-10-CM | POA: Diagnosis not present

## 2020-04-05 DIAGNOSIS — G629 Polyneuropathy, unspecified: Secondary | ICD-10-CM | POA: Diagnosis not present

## 2020-04-05 DIAGNOSIS — E7800 Pure hypercholesterolemia, unspecified: Secondary | ICD-10-CM | POA: Diagnosis not present

## 2020-04-05 DIAGNOSIS — N529 Male erectile dysfunction, unspecified: Secondary | ICD-10-CM | POA: Diagnosis not present

## 2020-04-05 DIAGNOSIS — R32 Unspecified urinary incontinence: Secondary | ICD-10-CM | POA: Diagnosis not present

## 2020-04-05 DIAGNOSIS — I10 Essential (primary) hypertension: Secondary | ICD-10-CM | POA: Diagnosis not present

## 2020-04-05 DIAGNOSIS — K219 Gastro-esophageal reflux disease without esophagitis: Secondary | ICD-10-CM | POA: Diagnosis not present

## 2020-04-05 DIAGNOSIS — R739 Hyperglycemia, unspecified: Secondary | ICD-10-CM | POA: Diagnosis not present

## 2020-04-05 DIAGNOSIS — F4310 Post-traumatic stress disorder, unspecified: Secondary | ICD-10-CM | POA: Diagnosis not present

## 2020-04-05 DIAGNOSIS — Z0000 Encounter for general adult medical examination without abnormal findings: Secondary | ICD-10-CM | POA: Diagnosis not present

## 2020-04-05 DIAGNOSIS — C61 Malignant neoplasm of prostate: Secondary | ICD-10-CM | POA: Diagnosis not present

## 2020-05-25 DIAGNOSIS — E291 Testicular hypofunction: Secondary | ICD-10-CM | POA: Diagnosis not present

## 2020-05-25 DIAGNOSIS — Z8546 Personal history of malignant neoplasm of prostate: Secondary | ICD-10-CM | POA: Diagnosis not present

## 2020-05-30 DIAGNOSIS — R3915 Urgency of urination: Secondary | ICD-10-CM | POA: Diagnosis not present

## 2020-05-30 DIAGNOSIS — Z8546 Personal history of malignant neoplasm of prostate: Secondary | ICD-10-CM | POA: Diagnosis not present

## 2020-05-30 DIAGNOSIS — N5201 Erectile dysfunction due to arterial insufficiency: Secondary | ICD-10-CM | POA: Diagnosis not present

## 2020-05-30 DIAGNOSIS — E291 Testicular hypofunction: Secondary | ICD-10-CM | POA: Diagnosis not present

## 2020-08-04 DIAGNOSIS — J018 Other acute sinusitis: Secondary | ICD-10-CM | POA: Diagnosis not present

## 2020-08-04 DIAGNOSIS — J309 Allergic rhinitis, unspecified: Secondary | ICD-10-CM | POA: Diagnosis not present

## 2020-10-31 ENCOUNTER — Other Ambulatory Visit: Payer: Self-pay

## 2020-10-31 ENCOUNTER — Ambulatory Visit (HOSPITAL_COMMUNITY)
Admission: EM | Admit: 2020-10-31 | Discharge: 2020-10-31 | Disposition: A | Discharge status: Home / Self Care | Payer: PPO | Attending: Physician Assistant | Admitting: Physician Assistant

## 2020-10-31 ENCOUNTER — Encounter (HOSPITAL_COMMUNITY): Payer: Self-pay | Admitting: Emergency Medicine

## 2020-10-31 DIAGNOSIS — J329 Chronic sinusitis, unspecified: Secondary | ICD-10-CM

## 2020-10-31 DIAGNOSIS — Z8616 Personal history of COVID-19: Secondary | ICD-10-CM

## 2020-10-31 DIAGNOSIS — J40 Bronchitis, not specified as acute or chronic: Secondary | ICD-10-CM

## 2020-10-31 DIAGNOSIS — R059 Cough, unspecified: Secondary | ICD-10-CM

## 2020-10-31 DIAGNOSIS — U071 COVID-19: Secondary | ICD-10-CM | POA: Diagnosis not present

## 2020-10-31 MED ORDER — AMOXICILLIN-POT CLAVULANATE 875-125 MG PO TABS: 1.0000 | ORAL_TABLET | Freq: Two times a day (BID) | ORAL | 0 refills | Status: DC

## 2020-10-31 MED ORDER — BENZONATATE 100 MG PO CAPS: 100.0000 mg | ORAL_CAPSULE | Freq: Three times a day (TID) | ORAL | 0 refills | Status: DC

## 2020-10-31 NOTE — ED Provider Notes (Signed)
[facility]    CSN: [id number] Arrival date & time: 10/31/20  1152      History   Chief Complaint Chief Complaint  Patient presents with  . Home COVID positive test  . Cough    HPI James Acevedo is a 80 y.o. male.   Patient presents today with a 8-day history of cough.  He reports associated sore throat from drainage.  Denies fever, chest pain, shortness of breath.  He does have a history of allergies currently managed with Flonase, Claritin, Singulair.  He also has a history of asthma managed with albuterol.  He has not required increased use of albuterol since symptom onset.  He did take it at home COVID test several days ago that was positive.  He reports having influenza and COVID-19 vaccinations.  Denies any known sick contacts.  Reports current symptoms are similar to previous episodes of sinus infections.  He denies any recent antibiotic use.  He is a former smoker but quit many years ago.     Past Medical History:  Diagnosis Date  . Agent orange exposure   . Asthma   . Cancer ([place])   . Depression   . GERD (gastroesophageal reflux disease)   . History of TIA (transient ischemic attack) OCTOBER 2012--  NO RESIDUAL   NEURO CONSULT IN FEB 2013  . Hypertension   . Prostate cancer ([place]) 04/12/10   gleason 3+3=6, volume 40 cc  . Sinus abscess     Patient Active Problem List   Diagnosis Date Noted  . 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 Allergic rhinoconjunctivitis 02/26/2015  . Depression   . Agent orange exposure   . Hypertension   . History of TIA (transient ischemic attack)   . Cancer ([place])   . GERD (gastroesophageal reflux disease)   . Prostate cancer ([place]) 09/27/2011    Past Surgical History:  Procedure Laterality Date  . CYSTOSCOPY  11/26/2011   Procedure: CYSTOSCOPY;  Surgeon: Bernestine Amass, MD;  Location: [facility];  Service: Urology;  Laterality: N/A;  NO SEEDS FOUND IN BLADDER  . EXICISON LIPOMA     BACK  IN  2006  AND HEAD  IN 2009  . LEFT WRIST RECONSTRUCTION  2000  . PROSTATE BIOPSY  08/02/11   gleason 3+3=6, volume 35 cc  . RADIOACTIVE SEED IMPLANT  11/26/2011   Procedure: RADIOACTIVE SEED IMPLANT;  Surgeon: Bernestine Amass, MD;  Location: [facility];  Service: Urology;  Laterality: N/A;   70 SEEDS IMPLANTED  . TONSILLECTOMY  AGE 40       Home Medications    Prior to Admission medications   Medication Sig Start Date End Date Taking? Authorizing Provider  amoxicillin-clavulanate (AUGMENTIN) 875-125 MG tablet Take 1 tablet by mouth every 12 (twelve) hours. 10/31/20  Yes Skyanne Welle K, PA-C  benzonatate (TESSALON) 100 MG capsule Take 1 capsule (100 mg total) by mouth every 8 (eight) hours. 10/31/20  Yes Aniaya Bacha, Derry Skill, PA-C  bisoprolol-hydrochlorothiazide (ZIAC) 5-6.25 MG per tablet Take 1 tablet by mouth daily.   Yes [provider]  buPROPion (ZYBAN) 150 MG 12 hr tablet Take 150 mg by mouth every evening.    Yes [provider]  cholecalciferol (VITAMIN D) 1000 UNITS tablet Take 1,000 Units by mouth daily.   Yes [provider]  clopidogrel (PLAVIX) 75 MG tablet take 1 tablet by mouth once daily 10/29/13  Yes Marcial Pacas, MD  fluticasone [facility]) 50 MCG/ACT nasal spray Place  1 spray into both nostrils daily. 04/19/16  Yes [provider]  loratadine (CLARITIN) 10 MG tablet Take 10 mg by mouth every morning.    Yes [provider]  montelukast (SINGULAIR) 10 MG tablet Take 1 tablet (10 mg total) by mouth at bedtime. 05/01/16  Yes Kozlow, Donnamarie Poag, MD  Multiple Vitamin (MULTIVITAMIN) tablet Take 1 tablet by mouth daily.   Yes [provider]  pantoprazole (PROTONIX) 40 MG tablet Take 40 mg by mouth 2 (two) times daily.    Yes [provider]  pravastatin (PRAVACHOL) 20 MG tablet Take 20 mg by mouth every evening.    Yes [provider]  ranitidine (ZANTAC) 300 MG tablet Take 1 tablet (300 mg total) by mouth at  bedtime. 05/01/16  Yes Kozlow, Donnamarie Poag, MD  solifenacin (VESICARE) 5 MG tablet Take 5 mg by mouth daily.   Yes [provider]  Tamsulosin HCl (FLOMAX) 0.4 MG CAPS Take 0.4 mg by mouth daily after supper.    Yes [provider]  valsartan (DIOVAN) 320 MG tablet Take 320 mg by mouth every evening.   Yes [provider]  albuterol (VENTOLIN HFA) 108 (90 Base) MCG/ACT inhaler Inhale 2 puffs into the lungs every 4 (four) hours as needed for wheezing or shortness of breath.    [provider]  mometasone (ASMANEX) 220 MCG/INH inhaler Inhale 1 puff into the lungs 2 (two) times daily.    [provider]    Family History Family History  Problem Relation Age of Onset  . Cancer Mother        lung  . COPD Father   . Cancer Maternal Aunt        unknown//blood?  . Cancer Maternal Uncle        unknown    Social History Social History   Tobacco Use  . Smoking status: Former Smoker    Packs/day: 1.00    Years: 10.00    Pack years: 10.00    Types: Cigarettes    Quit date: 06/18/1978    Years since quitting: 42.4  . Smokeless tobacco: Never Used  Substance Use Topics  . Alcohol use: No    Comment: drinks socially  . Drug use: No     Allergies   Patient has no known allergies.   Review of Systems Review of Systems  Constitutional: Negative for activity change, appetite change, fatigue and fever.  HENT: Positive for congestion, sinus pressure and sore throat. Negative for sneezing.   Respiratory: Positive for cough. Negative for shortness of breath.   Cardiovascular: Negative for chest pain.  Gastrointestinal: Negative for abdominal pain, diarrhea, nausea and vomiting.  Musculoskeletal: Negative for arthralgias and myalgias.  Neurological: Positive for headaches. Negative for dizziness and light-headedness.     Physical Exam Triage Vital Signs ED Triage Vitals  Enc Vitals Group     BP 10/31/20 1354 132/69     Pulse Rate 10/31/20 1354  67     Resp --      Temp 10/31/20 1354 98.7 F (37.1 C)     Temp Source 10/31/20 1354 Oral     SpO2 10/31/20 1354 98 %     Weight --      Height --      Head Circumference --      Peak Flow --      Pain Score 10/31/20 1356 0     Pain Loc --      Pain Edu? --  Excl. in GC? --    No data found.  Updated Vital Signs BP 132/69 (BP Location: Right Arm)   Pulse 67   Temp 98.7 F (37.1 C) (Oral)   SpO2 98%   Visual Acuity Right Eye Distance:   Left Eye Distance:   Bilateral Distance:    Right Eye Near:   Left Eye Near:    Bilateral Near:     Physical Exam Vitals reviewed.  Constitutional:      General: He is awake.     Appearance: Normal appearance. He is normal weight. He is not ill-appearing.     Comments: Very pleasant male appears stated age in no acute distress  HENT:     Head: Normocephalic and atraumatic.     Right Ear: Tympanic membrane, ear canal and external ear normal. Tympanic membrane is not erythematous or bulging.     Left Ear: Tympanic membrane, ear canal and external ear normal. Tympanic membrane is not erythematous or bulging.     Nose:     Right Sinus: Maxillary sinus tenderness present. No frontal sinus tenderness.     Left Sinus: Maxillary sinus tenderness present. No frontal sinus tenderness.     Mouth/Throat:     Pharynx: Uvula midline. Posterior oropharyngeal erythema present. No oropharyngeal exudate.     Comments: Moderate erythema and drainage in posterior pharynx Cardiovascular:     Rate and Rhythm: Normal rate and regular rhythm.     Heart sounds: No murmur heard.   Pulmonary:     Effort: Pulmonary effort is normal. No accessory muscle usage or respiratory distress.     Breath sounds: Normal breath sounds. No stridor. No wheezing, rhonchi or rales.     Comments: Clear to auscultation bilaterally Abdominal:     General: Bowel sounds are normal.     Palpations: Abdomen is soft.     Tenderness: There is no abdominal tenderness.   Lymphadenopathy:     Head:     Right side of head: No submental, submandibular or tonsillar adenopathy.     Left side of head: No submental, submandibular or tonsillar adenopathy.     Cervical: No cervical adenopathy.  Neurological:     Mental Status: He is alert.  Psychiatric:        Behavior: Behavior is cooperative.      UC Treatments / Results  Labs (all labs ordered are listed, but only abnormal results are displayed) Labs Reviewed - No data to display  EKG   Radiology No results found.  Procedures Procedures (including critical care time)  Medications Ordered in UC Medications - No data to display  Initial Impression / Assessment and Plan / UC Course  I have reviewed the triage vital signs and the nursing notes.  Pertinent labs & imaging results that were available during my care of the patient were reviewed by me and considered in my medical decision making (see chart for details).     No indication for repeat COVID-19 testing given patient has been symptomatic for over a week.  Given prolonged and worsening symptoms we will treat for sinus infection with Augmentin.  Patient was encouraged to continue previously prescribed allergy and asthma medication.  He was given Tessalon for cough.  Recommended he use Mucinex and Flonase for additional symptom relief.  Strict return precautions given to which patient expressed understanding.  Final Clinical Impressions(s) / UC Diagnoses   Final diagnoses:  Sinobronchitis  Cough  History of COVID-19     Discharge Instructions  We do not retest you for COVID-19 given you have been symptomatic for more than a week.  I am concerned you have developed a sinus infection and so I have called in Augmentin that you will take twice daily.  Please continue your allergy medication as previously prescribed.  Use Tessalon for cough.  If you have any worsening symptoms you need to be reevaluated.    ED Prescriptions     Medication Sig Dispense Auth. Provider   amoxicillin-clavulanate (AUGMENTIN) 875-125 MG tablet Take 1 tablet by mouth every 12 (twelve) hours. 14 tablet Azrael Huss K, PA-C   benzonatate (TESSALON) 100 MG capsule Take 1 capsule (100 mg total) by mouth every 8 (eight) hours. 21 capsule Francesco Provencal K, PA-C     PDMP not reviewed this encounter.   Terrilee Croak, PA-C 10/31/20 1446

## 2020-10-31 NOTE — ED Triage Notes (Signed)
Patient states that he has had a productive cough x 1 week, sore throat, afebrile.  Patient did a home COVID test which was positive.  Patient denies any OTC meds.  Patient is vaccinated.

## 2020-10-31 NOTE — Discharge Instructions (Addendum)
We do not retest you for COVID-19 given you have been symptomatic for more than a week.  I am concerned you have developed a sinus infection and so I have called in Augmentin that you will take twice daily.  Please continue your allergy medication as previously prescribed.  Use Tessalon for cough.  If you have any worsening symptoms you need to be reevaluated.

## 2021-04-10 DIAGNOSIS — Z125 Encounter for screening for malignant neoplasm of prostate: Secondary | ICD-10-CM | POA: Diagnosis not present

## 2021-04-10 DIAGNOSIS — R7303 Prediabetes: Secondary | ICD-10-CM | POA: Diagnosis not present

## 2021-04-10 DIAGNOSIS — I10 Essential (primary) hypertension: Secondary | ICD-10-CM | POA: Diagnosis not present

## 2021-04-10 DIAGNOSIS — Z0000 Encounter for general adult medical examination without abnormal findings: Secondary | ICD-10-CM | POA: Diagnosis not present

## 2021-04-10 DIAGNOSIS — E559 Vitamin D deficiency, unspecified: Secondary | ICD-10-CM | POA: Diagnosis not present

## 2021-04-13 ENCOUNTER — Other Ambulatory Visit: Payer: Self-pay

## 2021-04-13 ENCOUNTER — Ambulatory Visit (HOSPITAL_COMMUNITY)
Admission: EM | Admit: 2021-04-13 | Discharge: 2021-04-13 | Disposition: A | Discharge status: Home / Self Care | Payer: Non-veteran care | Attending: Physician Assistant | Admitting: Physician Assistant

## 2021-04-13 ENCOUNTER — Ambulatory Visit (INDEPENDENT_AMBULATORY_CARE_PROVIDER_SITE_OTHER): Payer: Non-veteran care

## 2021-04-13 ENCOUNTER — Encounter (HOSPITAL_COMMUNITY): Payer: Self-pay

## 2021-04-13 DIAGNOSIS — R0689 Other abnormalities of breathing: Secondary | ICD-10-CM | POA: Diagnosis not present

## 2021-04-13 DIAGNOSIS — J4521 Mild intermittent asthma with (acute) exacerbation: Secondary | ICD-10-CM

## 2021-04-13 DIAGNOSIS — R059 Cough, unspecified: Secondary | ICD-10-CM

## 2021-04-13 DIAGNOSIS — J209 Acute bronchitis, unspecified: Secondary | ICD-10-CM | POA: Diagnosis not present

## 2021-04-13 DIAGNOSIS — R052 Subacute cough: Secondary | ICD-10-CM

## 2021-04-13 MED ORDER — DOXYCYCLINE HYCLATE 100 MG PO CAPS: 100.0000 mg | ORAL_CAPSULE | Freq: Two times a day (BID) | ORAL | 0 refills | Status: DC

## 2021-04-13 MED ORDER — ALBUTEROL SULFATE HFA 108 (90 BASE) MCG/ACT IN AERS
2.0000 | INHALATION_SPRAY | Freq: Once | RESPIRATORY_TRACT | Status: AC
  Administered 2021-04-13: 2 via RESPIRATORY_TRACT

## 2021-04-13 MED ORDER — PREDNISONE 10 MG PO TABS: 20.0000 mg | ORAL_TABLET | Freq: Every day | ORAL | 0 refills | Status: AC

## 2021-04-13 MED ORDER — ALBUTEROL SULFATE HFA 108 (90 BASE) MCG/ACT IN AERS
INHALATION_SPRAY | RESPIRATORY_TRACT | Status: AC
  Filled 2021-04-13: qty 6.7

## 2021-04-13 NOTE — ED Provider Notes (Signed)
[place]    CSN: [id number] Arrival date & time: 04/13/21  1247      History   Chief Complaint Chief Complaint  Patient presents with   Cough   Sore Throat    HPI James Acevedo is a 80 y.o. male.   Patient presents today with a 1 month history of cough.  He does have a history of asthma and has been using albuterol inhaler with improvement but not resolution of symptoms.  He reports cough is productive and triggered with deep breathing and cold.  He describes sputum as thick and colored.  He reports associated sore throat but denies fever, shortness of breath, chest pain, nausea, vomiting, weakness.  [facility] but does not member what was but states it was ineffective.  He reports general malaise prompting evaluation today.  He is a former smoker but denies formal diagnosis of COPD.  Last antibiotic use was Augmentin in May 2022.  He is prescribed maintenance medication of Asmanex inhaler which he has been using as prescribed.   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   Allergic rhinoconjunctivitis 02/26/2015   Depression    Agent orange exposure    Hypertension    History of TIA (transient ischemic attack)    Cancer ([facility])    GERD (gastroesophageal reflux disease)    Prostate cancer ([place]) 09/27/2011    Past Surgical History:  Procedure Laterality Date   CYSTOSCOPY  11/26/2011   Procedure: CYSTOSCOPY;  Surgeon: Bernestine Amass, MD;  Location: [facility];  Service: Urology;  Laterality: N/A;  NO SEEDS  FOUND IN BLADDER   EXICISON LIPOMA     BACK  IN 2006  AND HEAD  IN 2009   LEFT WRIST RECONSTRUCTION  2000   PROSTATE BIOPSY  08/02/11   gleason 3+3=6, volume 35 cc   RADIOACTIVE SEED IMPLANT  11/26/2011   Procedure: RADIOACTIVE SEED IMPLANT;  Surgeon: Bernestine Amass, MD;  Location: [facility];  Service: Urology;  Laterality: N/A;   37 SEEDS IMPLANTED   TONSILLECTOMY  AGE 19       Home Medications    Prior to Admission medications   Medication Sig Start Date End Date Taking? Authorizing Provider  doxycycline (VIBRAMYCIN) 100 MG capsule Take 1 capsule (100 mg total) by mouth 2 (two) times daily. 04/13/21  Yes Rihanna Marseille K, PA-C  predniSONE (DELTASONE) 10 MG tablet Take 2 tablets (20 mg total) by mouth daily for 4 days. 04/13/21 04/17/21 Yes Zarya Lasseigne K, PA-C  albuterol (VENTOLIN HFA) 108 (90 Base) MCG/ACT inhaler Inhale 2 puffs into the lungs every 4 (four) hours as needed for wheezing or shortness of breath.    [provider]  bisoprolol-hydrochlorothiazide (ZIAC) 5-6.25 MG per tablet Take 1 tablet by mouth daily.    [provider]  buPROPion (ZYBAN) 150 MG 12 hr tablet Take 150 mg by mouth every evening.     [provider]  cholecalciferol (VITAMIN D) 1000 UNITS tablet Take 1,000 Units by mouth daily.    [provider]  clopidogrel (PLAVIX) 75 MG tablet take 1 tablet by mouth once daily 10/29/13   Marcial Pacas, MD  fluticasone [facility]) 50 MCG/ACT nasal spray Place 1 spray into both nostrils daily. 04/19/16   [provider]  loratadine (CLARITIN) 10 MG tablet Take 10 mg by mouth every morning.     [provider]  mometasone (ASMANEX) 220 MCG/INH inhaler Inhale 1 puff into the lungs 2 (two) times daily.    [provider]  montelukast (SINGULAIR) 10 MG tablet Take 1 tablet (10 mg total) by mouth at bedtime. 05/01/16   Kozlow, Donnamarie Poag, MD  Multiple Vitamin (MULTIVITAMIN) tablet Take 1 tablet by mouth daily.     [provider]  pantoprazole (PROTONIX) 40 MG tablet Take 40 mg by mouth 2 (two) times daily.     [provider]  pravastatin (PRAVACHOL) 20 MG tablet Take 20 mg by mouth every evening.     [provider]  ranitidine (ZANTAC) 300 MG tablet Take 1 tablet (300 mg total) by mouth at bedtime. 05/01/16   Kozlow, Donnamarie Poag, MD  solifenacin (VESICARE) 5 MG tablet Take 5 mg by mouth daily.    [provider]  Tamsulosin HCl (FLOMAX) 0.4 MG CAPS Take 0.4 mg by mouth daily after supper.     [provider]  valsartan (DIOVAN) 320 MG tablet Take 320 mg by mouth every evening.    [provider]    Family History Family History  Problem Relation Age of Onset   Cancer Mother        lung   COPD Father    Cancer Maternal Aunt        unknown//blood?   Cancer Maternal Uncle        unknown    Social History Social History   Tobacco Use   Smoking status: Former    Packs/day: 1.00    Years: 10.00    Pack years: 10.00    Types: Cigarettes    Quit date: 06/18/1978    Years since quitting: 42.8   Smokeless tobacco: Never  Substance Use Topics   Alcohol use: No    Comment: drinks socially   Drug use: No     Allergies   Patient has no known allergies.   Review of Systems Review of Systems  Constitutional:  Positive for activity change and fatigue. Negative for appetite change and fever.  HENT:  Positive for congestion and sore throat. Negative for sinus pressure and sneezing.   Respiratory:  Positive for cough. Negative for chest tightness and shortness of breath.   Cardiovascular:  Negative for chest pain.  Gastrointestinal:  Negative for abdominal pain, diarrhea, nausea and vomiting.  Neurological:  Negative for dizziness, light-headedness and headaches.    Physical Exam Triage Vital Signs ED Triage Vitals  Enc Vitals Group     BP 04/13/21 1412 137/85     Pulse Rate 04/13/21 1412 67     Resp 04/13/21 1412 18     Temp 04/13/21  1412 (!) 97.4 F (36.3 C)     Temp Source 04/13/21 1412 Oral     SpO2 04/13/21 1412 93 %     Weight --      Height --      Head Circumference --      Peak Flow --      Pain Score 04/13/21 1414 5  Pain Loc --      Pain Edu? --      Excl. in [place]? --    No data found.  Updated Vital Signs BP 137/85 (BP Location: Left Arm)   Pulse 67   Temp (!) 97.4 F (36.3 C) (Oral)   Resp 18   SpO2 93%   Visual Acuity Right Eye Distance:   Left Eye Distance:   Bilateral Distance:    Right Eye Near:   Left Eye Near:    Bilateral Near:     Physical Exam Vitals reviewed.  Constitutional:      General: He is awake.     Appearance: Normal appearance. He is well-developed. He is not ill-appearing.     Comments: Very pleasant male appears stated age in no acute distress sitting comfortably in exam room  HENT:     Head: Normocephalic and atraumatic.     Right Ear: Tympanic membrane, ear canal and external ear normal. Tympanic membrane is not erythematous or bulging.     Left Ear: Tympanic membrane, ear canal and external ear normal. Tympanic membrane is not erythematous or bulging.     Nose: Nose normal.     Right Sinus: No maxillary sinus tenderness or frontal sinus tenderness.     Left Sinus: No maxillary sinus tenderness or frontal sinus tenderness.     Mouth/Throat:     Pharynx: Uvula midline. Posterior oropharyngeal erythema present. No oropharyngeal exudate.  Cardiovascular:     Rate and Rhythm: Normal rate and regular rhythm.     Heart sounds: Normal heart sounds, S1 normal and S2 normal. No murmur heard. Pulmonary:     Effort: Pulmonary effort is normal. No accessory muscle usage or respiratory distress.     Breath sounds: No stridor. Wheezing and rhonchi present. No rales.     Comments: Wheezing throughout all lung fields.  Scattered rhonchi worse in right middle lobe partially clear with cough.  Reactive cough with deep breathing. Abdominal:     General: Bowel sounds are  normal.     Palpations: Abdomen is soft.     Tenderness: There is no abdominal tenderness.  Neurological:     Mental Status: He is alert.  Psychiatric:        Behavior: Behavior is cooperative.     UC Treatments / Results  Labs (all labs ordered are listed, but only abnormal results are displayed) Labs Reviewed - No data to display  EKG   Radiology DG Chest 2 View  Result Date: 04/13/2021 CLINICAL DATA:  Cough for 1 month, abnormal lung sounds EXAM: CHEST - 2 VIEW COMPARISON:  10/24/2011 chest radiograph. FINDINGS: Stable cardiomediastinal silhouette with top-normal heart size. No pneumothorax. No pleural effusion. Lungs appear clear, with no acute consolidative airspace disease and no pulmonary edema. IMPRESSION: No active cardiopulmonary disease. Electronically Signed   By: Ilona Sorrel M.D.   On: 04/13/2021 14:59    Procedures Procedures (including critical care time)  Medications Ordered in UC Medications  albuterol (VENTOLIN HFA) 108 (90 Base) MCG/ACT inhaler 2 puff (2 puffs Inhalation Given 04/13/21 1512)    Initial Impression / Assessment and Plan / UC Course  I have reviewed the triage vital signs and the nursing notes.  Pertinent labs & imaging results that were available during my care of the patient were reviewed by me and considered in my medical decision making (see chart for details).     No indication for viral testing given prolonged history of symptoms.  Given rhonchi  on exam will cover with doxycycline 100 mg twice daily.  He was instructed to stay out of the sun while on this medication due to photosensitivity associated with this medicine.  He was encouraged to continue asthma medication as previously prescribed including albuterol every 4-6 hours as needed.  He was prescribed prednisone 20 mg to help with symptoms with instruction not to take NSAIDs with this medication due to risk of GI bleeding.  He denies history of diabetes.  Recommended Tylenol,  Mucinex, Flonase for symptom relief.  He is to rest and drink plenty of fluid.  Discussed that if symptoms not improving or if he has any worsening symptoms he needs to be seen immediately.  Discussed alarm symptoms that warrant going to the emergency room.  Strict return precautions given to which he expressed understanding.  Final Clinical Impressions(s) / UC Diagnoses   Final diagnoses:  Acute bronchitis, unspecified organism  Mild intermittent asthma with acute exacerbation  Subacute cough     Discharge Instructions      Take doxycycline 100 mg twice daily for 10 days.  Stay out of the sun while on this medication.  Continue using your albuterol inhaler as needed.  Start prednisone 20 mg for 4 days.  Do not take NSAIDs with prednisone due to risk of GI bleeding which includes aspirin, ibuprofen/Advil, naproxen/Aleve.  Use Tylenol, Mucinex, Flonase for additional symptom relief.  Continue your asthma medications as previously prescribed.  If your symptoms are not improving or if anything worsens you need to be seen immediately.  Go to the emergency room with any chest pain, high fever, worsening cough, shortness of breath.     ED Prescriptions     Medication Sig Dispense Auth. Provider   doxycycline (VIBRAMYCIN) 100 MG capsule Take 1 capsule (100 mg total) by mouth 2 (two) times daily. 20 capsule Jalayla Chrismer K, PA-C   predniSONE (DELTASONE) 10 MG tablet Take 2 tablets (20 mg total) by mouth daily for 4 days. 8 tablet Finnlee Silvernail, Derry Skill, PA-C      PDMP not reviewed this encounter.   Terrilee Croak, PA-C 04/13/21 1521

## 2021-04-13 NOTE — Discharge Instructions (Signed)
Take doxycycline 100 mg twice daily for 10 days.  Stay out of the sun while on this medication.  Continue using your albuterol inhaler as needed.  Start prednisone 20 mg for 4 days.  Do not take NSAIDs with prednisone due to risk of GI bleeding which includes aspirin, ibuprofen/Advil, naproxen/Aleve.  Use Tylenol, Mucinex, Flonase for additional symptom relief.  Continue your asthma medications as previously prescribed.  If your symptoms are not improving or if anything worsens you need to be seen immediately.  Go to the emergency room with any chest pain, high fever, worsening cough, shortness of breath.

## 2021-04-13 NOTE — ED Triage Notes (Signed)
Pt c/o coughing x1 month and sore throat x1 week; pt denies any other symptoms

## 2021-04-17 DIAGNOSIS — Z0000 Encounter for general adult medical examination without abnormal findings: Secondary | ICD-10-CM | POA: Diagnosis not present

## 2021-04-17 DIAGNOSIS — Z23 Encounter for immunization: Secondary | ICD-10-CM | POA: Diagnosis not present

## 2021-04-17 DIAGNOSIS — E039 Hypothyroidism, unspecified: Secondary | ICD-10-CM | POA: Diagnosis not present

## 2021-04-17 DIAGNOSIS — I10 Essential (primary) hypertension: Secondary | ICD-10-CM | POA: Diagnosis not present

## 2021-04-17 DIAGNOSIS — R7989 Other specified abnormal findings of blood chemistry: Secondary | ICD-10-CM | POA: Diagnosis not present

## 2021-04-17 DIAGNOSIS — J309 Allergic rhinitis, unspecified: Secondary | ICD-10-CM | POA: Diagnosis not present

## 2021-04-17 DIAGNOSIS — E7800 Pure hypercholesterolemia, unspecified: Secondary | ICD-10-CM | POA: Diagnosis not present

## 2021-04-17 DIAGNOSIS — R7303 Prediabetes: Secondary | ICD-10-CM | POA: Diagnosis not present

## 2021-04-26 DIAGNOSIS — R0989 Other specified symptoms and signs involving the circulatory and respiratory systems: Secondary | ICD-10-CM | POA: Diagnosis not present

## 2021-04-26 DIAGNOSIS — R051 Acute cough: Secondary | ICD-10-CM | POA: Diagnosis not present

## 2021-04-28 DIAGNOSIS — Z1211 Encounter for screening for malignant neoplasm of colon: Secondary | ICD-10-CM | POA: Diagnosis not present

## 2021-04-28 DIAGNOSIS — Z1212 Encounter for screening for malignant neoplasm of rectum: Secondary | ICD-10-CM | POA: Diagnosis not present

## 2021-07-12 ENCOUNTER — Encounter: Payer: Self-pay | Admitting: Emergency Medicine

## 2021-07-12 ENCOUNTER — Other Ambulatory Visit: Payer: Self-pay

## 2021-07-12 DIAGNOSIS — S0990XA Unspecified injury of head, initial encounter: Secondary | ICD-10-CM | POA: Insufficient documentation

## 2021-07-12 DIAGNOSIS — J45909 Unspecified asthma, uncomplicated: Secondary | ICD-10-CM | POA: Insufficient documentation

## 2021-07-12 DIAGNOSIS — S01411A Laceration without foreign body of right cheek and temporomandibular area, initial encounter: Secondary | ICD-10-CM | POA: Diagnosis not present

## 2021-07-12 DIAGNOSIS — Y9301 Activity, walking, marching and hiking: Secondary | ICD-10-CM | POA: Insufficient documentation

## 2021-07-12 DIAGNOSIS — W01198A Fall on same level from slipping, tripping and stumbling with subsequent striking against other object, initial encounter: Secondary | ICD-10-CM | POA: Diagnosis not present

## 2021-07-12 DIAGNOSIS — Z8546 Personal history of malignant neoplasm of prostate: Secondary | ICD-10-CM | POA: Insufficient documentation

## 2021-07-12 DIAGNOSIS — I10 Essential (primary) hypertension: Secondary | ICD-10-CM | POA: Insufficient documentation

## 2021-07-12 DIAGNOSIS — S0993XA Unspecified injury of face, initial encounter: Secondary | ICD-10-CM | POA: Diagnosis present

## 2021-07-12 NOTE — ED Triage Notes (Signed)
Patient ambulatory to triage with steady gait, without difficulty or distress noted; pt reports that he slipped and fell; small lac noted to rt side upper lip with no active bleeding; denies any other c/o or injuries

## 2021-07-13 ENCOUNTER — Emergency Department: Payer: No Typology Code available for payment source

## 2021-07-13 ENCOUNTER — Emergency Department
Admission: EM | Admit: 2021-07-13 | Discharge: 2021-07-13 | Disposition: A | Discharge status: Home / Self Care | Payer: No Typology Code available for payment source | Attending: Emergency Medicine | Admitting: Emergency Medicine

## 2021-07-13 DIAGNOSIS — S0181XA Laceration without foreign body of other part of head, initial encounter: Secondary | ICD-10-CM

## 2021-07-13 DIAGNOSIS — W19XXXA Unspecified fall, initial encounter: Secondary | ICD-10-CM

## 2021-07-13 NOTE — ED Notes (Signed)
E-signature pad unavailable - Pt verbalized understanding of D/C information - no additional concerns at this time.  

## 2021-07-13 NOTE — ED Provider Notes (Signed)
[facility] Provider Note    Event Date/Time   First MD Initiated Contact with Patient 07/13/21 0119     (approximate)   History   Laceration   HPI  James Acevedo is a 81 y.o. male past medical history of TIA, asthma, hypertension who presents with a laceration to the lip after a fall.  Patient was walking down the curb and tripped falling forward hitting his face on the concrete.  Sustained a small laceration to the right upper lip that has had some mild bleeding.  Denies any injury to his teeth.  Denies loss of consciousness or any syncope he has not had neck pain or any paresthesias he is not anticoagulated.    Past Medical History:  Diagnosis Date   Agent orange exposure    Asthma    Cancer ([place])    Depression    GERD (gastroesophageal reflux disease)    History of TIA (transient ischemic attack) OCTOBER 2012--  NO RESIDUAL   NEURO CONSULT IN FEB 2013   Hypertension    Prostate cancer ([place]) 04/12/10   gleason 3+3=6, volume 40 cc   Sinus abscess     Patient Active Problem List   Diagnosis Date Noted   Laryngopharyngeal reflux (LPR) 02/26/2015   Allergic rhinoconjunctivitis 02/26/2015   Depression    Agent orange exposure    Hypertension    History of TIA (transient ischemic attack)    Cancer (HCC)    GERD (gastroesophageal reflux disease)    Prostate cancer ([place]) 09/27/2011     Physical Exam  Triage Vital Signs: ED Triage Vitals  Enc Vitals Group     BP 07/13/21 0006 (!) 147/75     Pulse Rate 07/13/21 0006 71     Resp 07/13/21 0006 18     Temp 07/13/21 0006 97.9 F (36.6 C)     Temp Source 07/13/21 0006 Oral     SpO2 07/13/21 0006 98 %     Weight 07/12/21 2354 290 lb (131.5 kg)     Height 07/12/21 2354 6\' 2"  (1.88 m)     Head Circumference --      Peak Flow --      Pain Score 07/12/21 2353 3     Pain Loc --      Pain Edu? --      Excl. in [place]? --     Most recent vital signs: Vitals:   07/13/21 0006  BP: (!) 147/75   Pulse: 71  Resp: 18  Temp: 97.9 F (36.6 C)  SpO2: 98%     General: Awake, no distress.  CV:  Good peripheral perfusion.  Resp:  Normal effort.  Abd:  No distention.  Neuro:             Awake, Alert, Oriented x 3  Other:  Small elliptical laceration above the right lip that is hemostatic and well approximated Swelling of the right upper lip without any intraoral laceration  No C-spine tenderness   ED Results / Procedures / Treatments  Labs (all labs ordered are listed, but only abnormal results are displayed) Labs Reviewed - No data to display   EKG     RADIOLOGY I reviewed the CT of the cervical spine which does not show any acute fracture or misalignment; agree with radiology report   I reviewed the CT scan of the brain which does not show any acute intracranial process; agree with radiology report     PROCEDURES:  Critical Care  performed: No  ..Laceration Repair  Date/Time: 07/13/2021 4:30 AM Performed by: Rada Hay, MD Authorized by: Rada Hay, MD   Consent:    Consent obtained:  Verbal   Alternatives discussed:  No treatment Universal protocol:    Patient identity confirmed:  Verbally with patient Laceration details:    Location:  Face   Face location:  R cheek   Length (cm):  0.5 Exploration:    Limited defect created (wound extended): no   Treatment:    Amount of cleaning:  Standard   Irrigation solution:  Tap water   Visualized foreign bodies/material removed: no     Debridement:  None Skin repair:    Repair method:  Tissue adhesive Approximation:    Approximation:  Close Repair type:    Repair type:  Simple Post-procedure details:    Dressing:  Open (no dressing)   Procedure completion:  Tolerated  The patient is on the cardiac monitor to evaluate for evidence of arrhythmia and/or significant heart rate changes.   MEDICATIONS ORDERED IN ED: Medications - No data to display   IMPRESSION / MDM / [place] / ED COURSE  I reviewed the triage vital signs and the nursing notes.                              Differential diagnosis includes, but is not limited to, laceration, abrasion, intracranial hemorrhage  81 year old male presenting after mechanical fall off the curb.  He hit his face on the ground did not lose consciousness and it was not a syncopal episode.  On exam he has a fairly well approximated small elliptical laceration above the right lift and not involving the mucosa.  It is still well approximated I do not think it requires sutures will apply skin glue.  He denies hitting his actual occiput but given the fall and his age will obtain a CT head to rule out subdural or other intracranial hemorrhage as well as a CT of his C-spine.  He has no midline neck tenderness no neurologic symptoms.  He thinks his tetanus is up-to-date within the last 5 years so will defer.    CT head and neck are without acute traumatic findings.  The laceration was repaired with skin adhesive.  He is stable for discharge.   FINAL CLINICAL IMPRESSION(S) / ED DIAGNOSES   Final diagnoses:  Facial laceration, initial encounter  Fall, initial encounter     Rx / DC Orders   ED Discharge Orders     None        Note:  This document was prepared using Dragon voice recognition software and may include unintentional dictation errors.   Rada Hay, MD 07/13/21 [phone]

## 2021-07-13 NOTE — ED Notes (Signed)
Pt back from CT

## 2021-07-13 NOTE — ED Notes (Signed)
Pt to CT

## 2021-08-19 ENCOUNTER — Encounter (HOSPITAL_BASED_OUTPATIENT_CLINIC_OR_DEPARTMENT_OTHER): Payer: Self-pay | Admitting: Emergency Medicine

## 2021-08-19 ENCOUNTER — Other Ambulatory Visit: Payer: Self-pay

## 2021-08-19 ENCOUNTER — Emergency Department (HOSPITAL_BASED_OUTPATIENT_CLINIC_OR_DEPARTMENT_OTHER)
Admission: EM | Admit: 2021-08-19 | Discharge: 2021-08-19 | Disposition: A | Discharge status: Home / Self Care | Payer: No Typology Code available for payment source | Attending: Emergency Medicine | Admitting: Emergency Medicine

## 2021-08-19 DIAGNOSIS — B351 Tinea unguium: Secondary | ICD-10-CM | POA: Diagnosis not present

## 2021-08-19 DIAGNOSIS — M79674 Pain in right toe(s): Secondary | ICD-10-CM | POA: Diagnosis present

## 2021-08-19 DIAGNOSIS — Z7902 Long term (current) use of antithrombotics/antiplatelets: Secondary | ICD-10-CM | POA: Diagnosis not present

## 2021-08-19 NOTE — Discharge Instructions (Addendum)
As we discussed I do not see any evidence of a bacterial infection, or ingrown nail that requires drainage or acute management in the emergency department, you do have a significant fungal infection of the nails which could cause pain, and abnormal nail growth.  Please follow-up with podiatry at your earliest convenience for further management, and discussion of possible oral antifungal treatment. ?

## 2021-08-19 NOTE — ED Triage Notes (Signed)
Pt reports bilateral big toe infections from a pedicure he got 2 months ago. No open wound seen.  ?

## 2021-08-19 NOTE — ED Notes (Signed)
Dc instructions reviewed with pt no questions or concerns at this time. Will follow up with podiatrist.  ?

## 2021-08-19 NOTE — ED Notes (Signed)
Bilateral toes bandaged ?

## 2021-08-19 NOTE — ED Provider Notes (Signed)
?[facility] EM[facility] DEPT ?Provider Note ? ? ?CSN: [id number] ?Arrival date & time: 08/19/21  1217 ? ?  ? ?History ? ?Chief Complaint  ?Patient presents with  ? Nail Problem  ? ? ?James Acevedo is a 81 y.o. male with no significant past medical history presents with bilateral big toe pain.  Patient reports that he had a pedicure around 2 months ago and has had pain in the toes ever since.  Patient reports that he has to tape his big toes in order to walk without pain.  Patient has not seen a podiatrist in many years.  Patient denies any fever, chills, pain or redness spreading from the big toe up the foot.  He has no history of diabetes. ? ?HPI ? ?  ? ?Home Medications ?Prior to Admission medications   ?Medication Sig Start Date End Date Taking? Authorizing Provider  ?albuterol (VENTOLIN HFA) 108 (90 Base) MCG/ACT inhaler Inhale 2 puffs into the lungs every 4 (four) hours as needed for wheezing or shortness of breath.    [provider]  ?bisoprolol-hydrochlorothiazide (ZIAC) 5-6.25 MG per tablet Take 1 tablet by mouth daily.    [provider]  ?buPROPion (ZYBAN) 150 MG 12 hr tablet Take 150 mg by mouth every evening.     [provider]  ?cholecalciferol (VITAMIN D) 1000 UNITS tablet Take 1,000 Units by mouth daily.    [provider]  ?clopidogrel (PLAVIX) 75 MG tablet take 1 tablet by mouth once daily 10/29/13   Marcial Pacas, MD  ?doxycycline (VIBRAMYCIN) 100 MG capsule Take 1 capsule (100 mg total) by mouth 2 (two) times daily. 04/13/21   Raspet, Derry Skill, PA-C  ?fluticasone (FLONASE) 50 MCG/ACT nasal spray Place 1 spray into both nostrils daily. 04/19/16   [provider]  ?loratadine (CLARITIN) 10 MG tablet Take 10 mg by mouth every morning.     [provider]  ?mometasone (ASMANEX) 220 MCG/INH inhaler Inhale 1 puff into the lungs 2 (two) times daily.    [provider]  ?montelukast (SINGULAIR) 10 MG tablet Take 1 tablet (10 mg total) by  mouth at bedtime. 05/01/16   Kozlow, Donnamarie Poag, MD  ?Multiple Vitamin (MULTIVITAMIN) tablet Take 1 tablet by mouth daily.    [provider]  ?pantoprazole (PROTONIX) 40 MG tablet Take 40 mg by mouth 2 (two) times daily.     [provider]  ?pravastatin (PRAVACHOL) 20 MG tablet Take 20 mg by mouth every evening.     [provider]  ?ranitidine (ZANTAC) 300 MG tablet Take 1 tablet (300 mg total) by mouth at bedtime. 05/01/16   Kozlow, Donnamarie Poag, MD  ?solifenacin (VESICARE) 5 MG tablet Take 5 mg by mouth daily.    [provider]  ?Tamsulosin HCl (FLOMAX) 0.4 MG CAPS Take 0.4 mg by mouth daily after supper.     [provider]  ?valsartan (DIOVAN) 320 MG tablet Take 320 mg by mouth every evening.    [provider]  ?   ? ?Allergies    ?Patient has no known allergies.   ? ?Review of Systems   ?Review of Systems  ?Skin:  Positive for wound.  ?All other systems reviewed and are negative. ? ?Physical Exam ?Updated Vital Signs ?BP (!) 146/71   Pulse 70   Temp 97.8 ?F (36.6 ?C)   Resp 16   Ht '6\' 2"'$  (1.88 m)   Wt 131.5 kg   SpO2 97%   BMI 37.23 kg/m?  ?Physical  Exam ?Vitals and nursing note reviewed.  ?Constitutional:   ?   General: He is not in acute distress. ?   Appearance: Normal appearance.  ?HENT:  ?   Head: Normocephalic and atraumatic.  ?Eyes:  ?   General:     ?   Right eye: No discharge.     ?   Left eye: No discharge.  ?Cardiovascular:  ?   Rate and Rhythm: Normal rate and regular rhythm.  ?Pulmonary:  ?   Effort: Pulmonary effort is normal. No respiratory distress.  ?Musculoskeletal:     ?   General: No deformity.  ?Skin: ?   General: Skin is warm and dry.  ?   Comments: Patient with severe onychomycosis of all toes, particularly severe of bilateral big toes.  There is some redness without significant swelling of bilateral toes, and minimal tenderness to palpation.  There is no open wound, or area of fluctuance noted.  While it is possible that patient has  some early progression into an ingrown toenail, I do not see any isolated area of redness, pain, fluctuance.  ?Neurological:  ?   Mental Status: He is alert and oriented to person, place, and time.  ?Psychiatric:     ?   Mood and Affect: Mood normal.     ?   Behavior: Behavior normal.  ? ? ?ED Results / Procedures / Treatments   ?Labs ?(all labs ordered are listed, but only abnormal results are displayed) ?Labs Reviewed - No data to display ? ?EKG ?None ? ?Radiology ?No results found. ? ?Procedures ?Procedures  ? ? ?Medications Ordered in ED ?Medications - No data to display ? ?ED Course/ Medical Decision Making/ A&P ?  ?                        ?Medical Decision Making ? ?Is an overall well-appearing 81 year old male with no history of diabetes presents with bilateral big toe pain.  The emergent differential diagnosis includes paronychia, septic arthritis, onychomycosis, onychogryphosis, ingrown nail, versus other.  This is not an exhaustive differential. ? ?On physical exam patient has clinical appearance of mycosis.  I do not see any area of fluctuance, or infection of a bacterial nature.  Discussed that onychomycosis, early ingrown toenail changes can cause significant toe pain.  Encourage follow-up with podiatry for further management, possible oral Lamisil, and professional toenail management.  Patient understands agrees to plan, discharged in stable condition at this time.  Return precautions given. ?Final Clinical Impression(s) / ED Diagnoses ?Final diagnoses:  ?Onychomycosis  ?Pain due to onychomycosis of nail  ? ? ?Rx / DC Orders ?ED Discharge Orders   ? ? None  ? ?  ? ? ?  ?Anselmo Pickler, PA-C ?08/19/21 1329 ? ?  ?Sherwood Gambler, MD ?08/20/21 [phone] ? ?

## 2021-12-24 ENCOUNTER — Observation Stay (HOSPITAL_BASED_OUTPATIENT_CLINIC_OR_DEPARTMENT_OTHER)
Admission: EM | Admit: 2021-12-24 | Discharge: 2021-12-27 | Disposition: A | Discharge status: Home / Self Care | Payer: No Typology Code available for payment source | Attending: Internal Medicine | Admitting: Internal Medicine

## 2021-12-24 ENCOUNTER — Emergency Department (HOSPITAL_BASED_OUTPATIENT_CLINIC_OR_DEPARTMENT_OTHER): Payer: No Typology Code available for payment source

## 2021-12-24 ENCOUNTER — Other Ambulatory Visit: Payer: Self-pay

## 2021-12-24 ENCOUNTER — Encounter (HOSPITAL_BASED_OUTPATIENT_CLINIC_OR_DEPARTMENT_OTHER): Payer: Self-pay

## 2021-12-24 DIAGNOSIS — Z9989 Dependence on other enabling machines and devices: Secondary | ICD-10-CM

## 2021-12-24 DIAGNOSIS — R0600 Dyspnea, unspecified: Secondary | ICD-10-CM | POA: Diagnosis not present

## 2021-12-24 DIAGNOSIS — Z79899 Other long term (current) drug therapy: Secondary | ICD-10-CM | POA: Diagnosis not present

## 2021-12-24 DIAGNOSIS — I10 Essential (primary) hypertension: Secondary | ICD-10-CM | POA: Diagnosis not present

## 2021-12-24 DIAGNOSIS — Z8546 Personal history of malignant neoplasm of prostate: Secondary | ICD-10-CM | POA: Diagnosis not present

## 2021-12-24 DIAGNOSIS — I2694 Multiple subsegmental pulmonary emboli without acute cor pulmonale: Secondary | ICD-10-CM | POA: Diagnosis not present

## 2021-12-24 DIAGNOSIS — J45909 Unspecified asthma, uncomplicated: Secondary | ICD-10-CM | POA: Diagnosis not present

## 2021-12-24 DIAGNOSIS — R9431 Abnormal electrocardiogram [ECG] [EKG]: Secondary | ICD-10-CM | POA: Diagnosis not present

## 2021-12-24 DIAGNOSIS — R0781 Pleurodynia: Secondary | ICD-10-CM | POA: Diagnosis present

## 2021-12-24 DIAGNOSIS — Z8673 Personal history of transient ischemic attack (TIA), and cerebral infarction without residual deficits: Secondary | ICD-10-CM | POA: Diagnosis not present

## 2021-12-24 DIAGNOSIS — C61 Malignant neoplasm of prostate: Secondary | ICD-10-CM | POA: Diagnosis present

## 2021-12-24 DIAGNOSIS — G4733 Obstructive sleep apnea (adult) (pediatric): Secondary | ICD-10-CM

## 2021-12-24 DIAGNOSIS — Z87891 Personal history of nicotine dependence: Secondary | ICD-10-CM | POA: Diagnosis not present

## 2021-12-24 DIAGNOSIS — Z7902 Long term (current) use of antithrombotics/antiplatelets: Secondary | ICD-10-CM | POA: Insufficient documentation

## 2021-12-24 DIAGNOSIS — I2699 Other pulmonary embolism without acute cor pulmonale: Secondary | ICD-10-CM | POA: Diagnosis present

## 2021-12-24 LAB — BASIC METABOLIC PANEL
Anion gap: 12 (ref 5–15)
BUN: 16 mg/dL (ref 8–23)
CO2: 29 mmol/L (ref 22–32)
Calcium: 9.8 mg/dL (ref 8.9–10.3)
Chloride: 98 mmol/L (ref 98–111)
Creatinine, Ser: 1.2 mg/dL (ref 0.61–1.24)
GFR, Estimated: >60 mL/min (ref >60)
Glucose, Bld: 118 mg/dL — ABNORMAL HIGH (ref 70–99)
Potassium: 4 mmol/L (ref 3.5–5.1)
Sodium: 139 mmol/L (ref 135–145)

## 2021-12-24 LAB — CBC
HCT: 47.4 % (ref 39.0–52.0)
Hemoglobin: 15.5 g/dL (ref 13.0–17.0)
MCH: 28.9 pg (ref 26.0–34.0)
MCHC: 32.7 g/dL (ref 30.0–36.0)
MCV: 88.3 fL (ref 80.0–100.0)
Platelets: 203 10*3/uL (ref 150–400)
RBC: 5.37 MIL/uL (ref 4.22–5.81)
RDW: 14.4 % (ref 11.5–15.5)
WBC: 11.4 10*3/uL — ABNORMAL HIGH (ref 4.0–10.5)
nRBC: 0 % (ref 0.0–0.2)

## 2021-12-24 LAB — D-DIMER, QUANTITATIVE: D-Dimer, Quant: 1.27 ug/mL-FEU — ABNORMAL HIGH (ref 0.00–0.50)

## 2021-12-24 LAB — TROPONIN I (HIGH SENSITIVITY): Troponin I (High Sensitivity): 7 ng/L (ref <18)

## 2021-12-24 MED ORDER — ONDANSETRON 4 MG PO TBDP
4.0000 mg | ORAL_TABLET | Freq: Once | ORAL | Status: AC
  Administered 2021-12-24: 4 mg via ORAL
  Filled 2021-12-24: qty 1

## 2021-12-24 MED ORDER — KETOROLAC TROMETHAMINE 15 MG/ML IJ SOLN
15.0000 mg | Freq: Once | INTRAMUSCULAR | Status: AC
  Administered 2021-12-24: 15 mg via INTRAVENOUS
  Filled 2021-12-24: qty 1

## 2021-12-24 NOTE — ED Provider Notes (Signed)
[facility] EMERGENCY DEPT Provider Note   CSN: [id number] Arrival date & time: 12/24/21  2039     History {Add pertinent medical, surgical, social history, OB history to HPI:1} Chief Complaint  Patient presents with   Chest Pain    James Acevedo is a 81 y.o. male.  HPI     This is a 81 year old male with a history of asthma and prostate cancer who presents with chest pain.  Patient reports onset of right-sided chest discomfort over the last several days.  He describes it as sharp.  It is pleuritic and occurs when he deep breathes or moves in a certain way.  He is unsure of any injury but states that he does use both of his arms to steady himself when rising from a chair.  He questions whether he may have pulled a muscle.  He is not having any shortness of breath or cough.  No fevers.  Denies ambulatory symptoms.  Denies lower extremity swelling or history of blood clots.  Home Medications Prior to Admission medications   Medication Sig Start Date End Date Taking? Authorizing Provider  albuterol (VENTOLIN HFA) 108 (90 Base) MCG/ACT inhaler Inhale 2 puffs into the lungs every 4 (four) hours as needed for wheezing or shortness of breath.    [provider]  bisoprolol-hydrochlorothiazide (ZIAC) 5-6.25 MG per tablet Take 1 tablet by mouth daily.    [provider]  buPROPion (ZYBAN) 150 MG 12 hr tablet Take 150 mg by mouth every evening.     [provider]  cholecalciferol (VITAMIN D) 1000 UNITS tablet Take 1,000 Units by mouth daily.    [provider]  clopidogrel (PLAVIX) 75 MG tablet take 1 tablet by mouth once daily 10/29/13   Marcial Pacas, MD  doxycycline (VIBRAMYCIN) 100 MG capsule Take 1 capsule (100 mg total) by mouth 2 (two) times daily. 04/13/21   Raspet, Erin K, PA-C  fluticasone (FLONASE) 50 MCG/ACT nasal spray Place 1 spray into both nostrils daily. 04/19/16   [provider]  loratadine (CLARITIN) 10 MG tablet Take  10 mg by mouth every morning.     [provider]  mometasone (ASMANEX) 220 MCG/INH inhaler Inhale 1 puff into the lungs 2 (two) times daily.    [provider]  montelukast (SINGULAIR) 10 MG tablet Take 1 tablet (10 mg total) by mouth at bedtime. 05/01/16   Kozlow, Donnamarie Poag, MD  Multiple Vitamin (MULTIVITAMIN) tablet Take 1 tablet by mouth daily.    [provider]  pantoprazole (PROTONIX) 40 MG tablet Take 40 mg by mouth 2 (two) times daily.     [provider]  pravastatin (PRAVACHOL) 20 MG tablet Take 20 mg by mouth every evening.     [provider]  ranitidine (ZANTAC) 300 MG tablet Take 1 tablet (300 mg total) by mouth at bedtime. 05/01/16   Kozlow, Donnamarie Poag, MD  solifenacin (VESICARE) 5 MG tablet Take 5 mg by mouth daily.    [provider]  Tamsulosin HCl (FLOMAX) 0.4 MG CAPS Take 0.4 mg by mouth daily after supper.     [provider]  valsartan (DIOVAN) 320 MG tablet Take 320 mg by mouth every evening.    [provider]      Allergies    Patient has no known allergies.    Review of Systems   Review of Systems  Constitutional:  Negative for fever.  Respiratory:  Negative for shortness of breath.   Cardiovascular:  Positive  for chest pain. Negative for leg swelling.  All other systems reviewed and are negative.   Physical Exam Updated Vital Signs BP (!) 150/79   Pulse 75   Temp 97.6 F (36.4 C)   Resp 20   Ht 1.88 m ('6\' 2"'$ )   Wt 131.5 kg   SpO2 94%   BMI 37.22 kg/m  Physical Exam Vitals and nursing note reviewed.  Constitutional:      Appearance: He is well-developed. He is obese.  HENT:     Head: Normocephalic and atraumatic.  Eyes:     Pupils: Pupils are equal, round, and reactive to light.  Cardiovascular:     Rate and Rhythm: Normal rate and regular rhythm.     Heart sounds: Normal heart sounds. No murmur heard. Pulmonary:     Effort: Pulmonary effort is normal. No respiratory distress.      Breath sounds: Normal breath sounds. No wheezing.  Chest:     Chest wall: Tenderness present.  Abdominal:     General: Bowel sounds are normal.     Palpations: Abdomen is soft.     Tenderness: There is no abdominal tenderness. There is no rebound.  Musculoskeletal:     Cervical back: Neck supple.     Right lower leg: Edema present.     Left lower leg: Edema present.  Lymphadenopathy:     Cervical: No cervical adenopathy.  Skin:    General: Skin is warm and dry.  Neurological:     Mental Status: He is alert and oriented to person, place, and time.  Psychiatric:        Mood and Affect: Mood normal.     ED Results / Procedures / Treatments   Labs (all labs ordered are listed, but only abnormal results are displayed) Labs Reviewed  BASIC METABOLIC PANEL - Abnormal; Notable for the following components:      Result Value   Glucose, Bld 118 (*)    All other components within normal limits  CBC - Abnormal; Notable for the following components:   WBC 11.4 (*)    All other components within normal limits  D-DIMER, QUANTITATIVE  TROPONIN I (HIGH SENSITIVITY)  TROPONIN I (HIGH SENSITIVITY)    EKG EKG Interpretation  Date/Time:  Sunday December 24 2021 20:59:28 EDT Ventricular Rate:  80 PR Interval:  242 QRS Duration: 182 QT Interval:  442 QTC Calculation: 509 R Axis:   -11 Text Interpretation: Sinus rhythm with 1st degree A-V block Left bundle branch block Abnormal ECG No previous ECGs available Confirmed by Davonna Belling [phone]) on 12/24/2021 9:02:18 PM  Radiology No results found.  Procedures Procedures  {Document cardiac monitor, telemetry assessment procedure when appropriate:1}  Medications Ordered in ED Medications  ondansetron (ZOFRAN-ODT) disintegrating tablet 4 mg (has no administration in time range)  ketorolac (TORADOL) 15 MG/ML injection 15 mg (has no administration in time range)    ED Course/ Medical Decision Making/ A&P                            Medical Decision Making Amount and/or Complexity of Data Reviewed Labs: ordered.  Risk Prescription drug management.   ***  {Document critical care time when appropriate:1} {Document review of labs and clinical decision tools ie heart score, Chads2Vasc2 etc:1}  {Document your independent review of radiology images, and any outside records:1} {Document your discussion with family members, caretakers, and with consultants:1} {Document social determinants of health affecting pt's care:1} {Document  your decision making why or why not admission, treatments were needed:1} Final Clinical Impression(s) / ED Diagnoses Final diagnoses:  None    Rx / DC Orders ED Discharge Orders     None

## 2021-12-24 NOTE — ED Triage Notes (Signed)
Patient here POV from Home.  Endorses Pain to Right Chest that began approximately 1 week ago. Radiates to Upper back and Lateral Chest wall.  Moderate SOB. No N/V/D. No Fevers. States it may be a "Pulled Muscle" as it is more Painful when Patient moves Shoulder.   NAD Noted during Triage. A&Ox4. Gcs 15. Ambulatory

## 2021-12-25 ENCOUNTER — Emergency Department (HOSPITAL_BASED_OUTPATIENT_CLINIC_OR_DEPARTMENT_OTHER): Payer: No Typology Code available for payment source

## 2021-12-25 ENCOUNTER — Encounter (HOSPITAL_COMMUNITY): Payer: Self-pay | Admitting: Family Medicine

## 2021-12-25 DIAGNOSIS — G4733 Obstructive sleep apnea (adult) (pediatric): Secondary | ICD-10-CM | POA: Diagnosis not present

## 2021-12-25 DIAGNOSIS — I2699 Other pulmonary embolism without acute cor pulmonale: Secondary | ICD-10-CM | POA: Diagnosis present

## 2021-12-25 DIAGNOSIS — R9431 Abnormal electrocardiogram [ECG] [EKG]: Secondary | ICD-10-CM | POA: Diagnosis present

## 2021-12-25 DIAGNOSIS — I10 Essential (primary) hypertension: Secondary | ICD-10-CM | POA: Diagnosis not present

## 2021-12-25 DIAGNOSIS — Z8673 Personal history of transient ischemic attack (TIA), and cerebral infarction without residual deficits: Secondary | ICD-10-CM

## 2021-12-25 DIAGNOSIS — Z9989 Dependence on other enabling machines and devices: Secondary | ICD-10-CM

## 2021-12-25 DIAGNOSIS — Z7902 Long term (current) use of antithrombotics/antiplatelets: Secondary | ICD-10-CM | POA: Diagnosis not present

## 2021-12-25 DIAGNOSIS — Z8546 Personal history of malignant neoplasm of prostate: Secondary | ICD-10-CM | POA: Diagnosis not present

## 2021-12-25 DIAGNOSIS — I2694 Multiple subsegmental pulmonary emboli without acute cor pulmonale: Secondary | ICD-10-CM | POA: Diagnosis not present

## 2021-12-25 DIAGNOSIS — R0600 Dyspnea, unspecified: Secondary | ICD-10-CM | POA: Diagnosis not present

## 2021-12-25 DIAGNOSIS — J45909 Unspecified asthma, uncomplicated: Secondary | ICD-10-CM | POA: Diagnosis not present

## 2021-12-25 DIAGNOSIS — Z87891 Personal history of nicotine dependence: Secondary | ICD-10-CM | POA: Diagnosis not present

## 2021-12-25 DIAGNOSIS — Z79899 Other long term (current) drug therapy: Secondary | ICD-10-CM | POA: Diagnosis not present

## 2021-12-25 DIAGNOSIS — R0781 Pleurodynia: Secondary | ICD-10-CM | POA: Diagnosis present

## 2021-12-25 LAB — HEPARIN LEVEL (UNFRACTIONATED)
Heparin Unfractionated: 0.67 IU/mL (ref 0.30–0.70)
Heparin Unfractionated: 0.3 IU/mL (ref 0.30–0.70)
Heparin Unfractionated: 0.26 IU/mL — ABNORMAL LOW (ref 0.30–0.70)

## 2021-12-25 LAB — MAGNESIUM: Magnesium: 2.1 mg/dL (ref 1.7–2.4)

## 2021-12-25 LAB — TROPONIN I (HIGH SENSITIVITY): Troponin I (High Sensitivity): 6 ng/L (ref <18)

## 2021-12-25 LAB — BRAIN NATRIURETIC PEPTIDE: B Natriuretic Peptide: 93.2 pg/mL (ref 0.0–100.0)

## 2021-12-25 MED ORDER — OXYCODONE HCL 5 MG PO TABS
5.0000 mg | ORAL_TABLET | ORAL | Status: DC | PRN
  Administered 2021-12-25 – 2021-12-27 (×2): 5 mg via ORAL
  Filled 2021-12-25 (×2): qty 1

## 2021-12-25 MED ORDER — ALBUTEROL SULFATE (2.5 MG/3ML) 0.083% IN NEBU: 3.0000 mL | INHALATION_SOLUTION | RESPIRATORY_TRACT | Status: DC | PRN

## 2021-12-25 MED ORDER — HEPARIN BOLUS VIA INFUSION
5000.0000 [IU] | Freq: Once | INTRAVENOUS | Status: AC
  Administered 2021-12-25: 5000 [IU] via INTRAVENOUS

## 2021-12-25 MED ORDER — PANTOPRAZOLE SODIUM 40 MG PO TBEC
40.0000 mg | DELAYED_RELEASE_TABLET | Freq: Two times a day (BID) | ORAL | Status: DC
  Administered 2021-12-26: 40 mg via ORAL
  Filled 2021-12-25: qty 1

## 2021-12-25 MED ORDER — HEPARIN (PORCINE) 25000 UT/250ML-% IV SOLN: 14.0000 [IU]/kg/h | INTRAVENOUS | Status: DC

## 2021-12-25 MED ORDER — POLYETHYLENE GLYCOL 3350 17 G PO PACK
17.0000 g | PACK | Freq: Every day | ORAL | Status: DC | PRN
  Administered 2021-12-27: 17 g via ORAL
  Filled 2021-12-25: qty 1

## 2021-12-25 MED ORDER — PRAVASTATIN SODIUM 20 MG PO TABS
20.0000 mg | ORAL_TABLET | Freq: Every evening | ORAL | Status: DC
  Administered 2021-12-25 – 2021-12-26 (×2): 20 mg via ORAL
  Filled 2021-12-25 (×2): qty 1

## 2021-12-25 MED ORDER — HEPARIN (PORCINE) 25000 UT/250ML-% IV SOLN
1800.0000 [IU]/h | INTRAVENOUS | Status: DC
  Administered 2021-12-25 (×2): 1800 [IU]/h via INTRAVENOUS
  Filled 2021-12-25 (×2): qty 250

## 2021-12-25 MED ORDER — ACETAMINOPHEN 650 MG RE SUPP: 650.0000 mg | Freq: Four times a day (QID) | RECTAL | Status: DC | PRN

## 2021-12-25 MED ORDER — IRBESARTAN 150 MG PO TABS
150.0000 mg | ORAL_TABLET | Freq: Every day | ORAL | Status: DC
  Administered 2021-12-26 – 2021-12-27 (×2): 150 mg via ORAL
  Filled 2021-12-25 (×2): qty 1

## 2021-12-25 MED ORDER — MONTELUKAST SODIUM 10 MG PO TABS
10.0000 mg | ORAL_TABLET | Freq: Every day | ORAL | Status: DC
  Administered 2021-12-25 – 2021-12-26 (×2): 10 mg via ORAL
  Filled 2021-12-25 (×2): qty 1

## 2021-12-25 MED ORDER — PANTOPRAZOLE SODIUM 40 MG PO TBEC
40.0000 mg | DELAYED_RELEASE_TABLET | Freq: Every day | ORAL | Status: DC
  Administered 2021-12-25: 40 mg via ORAL
  Filled 2021-12-25: qty 1

## 2021-12-25 MED ORDER — DARIFENACIN HYDROBROMIDE ER 7.5 MG PO TB24
7.5000 mg | ORAL_TABLET | Freq: Every day | ORAL | Status: DC
  Administered 2021-12-26 – 2021-12-27 (×2): 7.5 mg via ORAL
  Filled 2021-12-25 (×2): qty 1

## 2021-12-25 MED ORDER — KETOROLAC TROMETHAMINE 15 MG/ML IJ SOLN
15.0000 mg | Freq: Four times a day (QID) | INTRAMUSCULAR | Status: DC | PRN
  Administered 2021-12-25 – 2021-12-27 (×3): 15 mg via INTRAVENOUS
  Filled 2021-12-25 (×3): qty 1

## 2021-12-25 MED ORDER — ACETAMINOPHEN 325 MG PO TABS: 650.0000 mg | ORAL_TABLET | Freq: Four times a day (QID) | ORAL | Status: DC | PRN

## 2021-12-25 MED ORDER — BISOPROLOL-HYDROCHLOROTHIAZIDE 5-6.25 MG PO TABS
1.0000 | ORAL_TABLET | Freq: Two times a day (BID) | ORAL | Status: DC
  Administered 2021-12-26 – 2021-12-27 (×3): 1 via ORAL
  Filled 2021-12-25 (×4): qty 1

## 2021-12-25 MED ORDER — SODIUM CHLORIDE 0.9% FLUSH
3.0000 mL | Freq: Two times a day (BID) | INTRAVENOUS | Status: DC
  Administered 2021-12-25 – 2021-12-27 (×4): 3 mL via INTRAVENOUS

## 2021-12-25 MED ORDER — APIXABAN 5 MG PO TABS: 5.0000 mg | ORAL_TABLET | Freq: Two times a day (BID) | ORAL | Status: DC

## 2021-12-25 MED ORDER — LEVOTHYROXINE SODIUM 50 MCG PO TABS
75.0000 ug | ORAL_TABLET | Freq: Every day | ORAL | Status: DC
  Administered 2021-12-26 – 2021-12-27 (×2): 75 ug via ORAL
  Filled 2021-12-25 (×2): qty 1

## 2021-12-25 MED ORDER — APIXABAN 5 MG PO TABS
10.0000 mg | ORAL_TABLET | Freq: Two times a day (BID) | ORAL | Status: DC
  Administered 2021-12-25 – 2021-12-27 (×4): 10 mg via ORAL
  Filled 2021-12-25 (×4): qty 2

## 2021-12-25 MED ORDER — IOHEXOL 350 MG/ML SOLN
80.0000 mL | Freq: Once | INTRAVENOUS | Status: AC | PRN
  Administered 2021-12-25: 80 mL via INTRAVENOUS

## 2021-12-25 NOTE — H&P (Signed)
History and Physical    Marquette Blodgett VFI:[id number] DOB: 1941/06/13 DOA: 12/24/2021  PCP: System, Provider Not In   Patient coming from: Home   Chief Complaint: right chest pain, SOB   HPI: James Acevedo is a pleasant 81 y.o. male with medical history significant for prostate cancer, asthma, OSA on CPAP, hypertension, and hypothyroidism, presenting to the emergency department with right sided chest pain.  Patient reports 4 to 5 days of pain in the right lateral chest that has been worse with deep breath.  He has had mild dyspnea associated with this, but no significant cough.  He denies any leg swelling or tenderness.  He denies any personal or family history of VTE.  He went on a cruise 3 weeks ago and rode in a car for 5 hours to get to the port.  No history of bleeding problems.  [facility] ED Course: Upon arrival to the ED, patient is found to be afebrile and saturating low 90s on room air with normal heart rate and blood pressure.  EKG features sinus rhythm with first-degree AV nodal block, chronic LBBB, and QTc interval 509 ms.  D-dimer was elevated to 1.27.  Troponin was normal x2.  CTA chest reveals acute right greater than left pulmonary emboli without heart strain, small right pleural effusion, and right upper lobe opacity suspicious for infarction.  Patient was treated with Toradol and Zofran, and started on IV heparin in the ED prior to transfer to [facility] for admission.  Review of Systems:  All other systems reviewed and apart from HPI, are negative.  Past Medical History:  Diagnosis Date   Agent orange exposure    Asthma    Cancer ([place])    Depression    GERD (gastroesophageal reflux disease)    History of TIA (transient ischemic attack) OCTOBER 2012--  NO RESIDUAL   NEURO CONSULT IN FEB 2013   Hypertension    Prostate cancer ([place]) 04/12/10   gleason 3+3=6, volume 40 cc   Sinus abscess     Past Surgical History:  Procedure Laterality Date    CYSTOSCOPY  11/26/2011   Procedure: CYSTOSCOPY;  Surgeon: Bernestine Amass, MD;  Location: [facility];  Service: Urology;  Laterality: N/A;  NO SEEDS FOUND IN BLADDER   EXICISON LIPOMA     BACK  IN 2006  AND HEAD  IN 2009   LEFT WRIST RECONSTRUCTION  2000   PROSTATE BIOPSY  08/02/11   gleason 3+3=6, volume 35 cc   RADIOACTIVE SEED IMPLANT  11/26/2011   Procedure: RADIOACTIVE SEED IMPLANT;  Surgeon: Bernestine Amass, MD;  Location: [facility];  Service: Urology;  Laterality: N/A;   24 SEEDS IMPLANTED   TONSILLECTOMY  AGE 76    Social History:   reports that he quit smoking about 43 years ago. His smoking use included cigarettes. He has a 10.00 pack-year smoking history. He has never used smokeless tobacco. He reports that he does not drink alcohol and does not use drugs.  Allergies  Allergen Reactions   Aspartame And Phenylalanine Other (See Comments)    "Makes my head feel odd"   Lisinopril Cough    Family History  Problem Relation Age of Onset   Cancer Mother        lung   COPD Father    Cancer Maternal Aunt        unknown//blood?   Cancer Maternal Uncle        unknown  Prior to Admission medications   Medication Sig Start Date End Date Taking? Authorizing Provider  albuterol (VENTOLIN HFA) 108 (90 Base) MCG/ACT inhaler Inhale 2 puffs into the lungs every 4 (four) hours as needed for wheezing or shortness of breath.   Yes [provider]  bisoprolol-hydrochlorothiazide (ZIAC) 5-6.25 MG per tablet Take 1 tablet by mouth 2 (two) times daily.   Yes [provider]  Cholecalciferol (VITAMIN D3 PO) Take 1 capsule by mouth daily.   Yes [provider]  fluticasone (FLONASE) 50 MCG/ACT nasal spray Place 1 spray into both nostrils daily as needed for allergies or rhinitis. 04/19/16  Yes [provider]  irbesartan (AVAPRO) 150 MG tablet Take 150 mg by mouth daily.   Yes [provider]  levothyroxine (SYNTHROID)  75 MCG tablet Take 75 mcg by mouth daily before breakfast.   Yes [provider]  loratadine (CLARITIN) 10 MG tablet Take 10 mg by mouth at bedtime.   Yes [provider]  Multiple Vitamin (MULTIVITAMIN) tablet Take 1 tablet by mouth daily with breakfast.   Yes [provider]  pantoprazole (PROTONIX) 40 MG tablet Take 40 mg by mouth 2 (two) times daily.    Yes [provider]  pravastatin (PRAVACHOL) 20 MG tablet Take 20 mg by mouth every evening.    Yes [provider]  solifenacin (VESICARE) 5 MG tablet Take 5 mg by mouth at bedtime.   Yes [provider]  valsartan (DIOVAN) 320 MG tablet Take 320 mg by mouth every evening.   Yes [provider]  clopidogrel (PLAVIX) 75 MG tablet take 1 tablet by mouth once daily Patient not taking: Reported on 12/25/2021 10/29/13   Marcial Pacas, MD  doxycycline (VIBRAMYCIN) 100 MG capsule Take 1 capsule (100 mg total) by mouth 2 (two) times daily. Patient not taking: Reported on 12/25/2021 04/13/21   Raspet, Erin K, PA-C  mometasone (ASMANEX) 220 MCG/INH inhaler Inhale 1 puff into the lungs 2 (two) times daily.    [provider]  montelukast (SINGULAIR) 10 MG tablet Take 1 tablet (10 mg total) by mouth at bedtime. 05/01/16   Kozlow, Donnamarie Poag, MD  ranitidine (ZANTAC) 300 MG tablet Take 1 tablet (300 mg total) by mouth at bedtime. Patient not taking: Reported on 12/25/2021 05/01/16   Jiles Prows, MD    Physical Exam: Vitals:   12/25/21 1130 12/25/21 1400 12/25/21 1655 12/25/21 1830  BP: 140/75 133/67 130/76 (!) 124/93  Pulse: 79 73 76 75  Resp: (!) '24 20 18 18  '$ Temp:    98.3 F (36.8 C)  TempSrc:    Oral  SpO2: 92% 92% 99% 100%  Weight:      Height:    '6\' 2"'$  (1.88 m)    Constitutional: NAD, calm  Eyes: PERTLA, lids and conjunctivae normal ENMT: Mucous membranes are moist. Posterior pharynx clear of any exudate or lesions.   Neck: supple, no masses  Respiratory:  no wheezing, no  crackles. No accessory muscle use.  Cardiovascular: S1 & S2 heard, regular rate and rhythm. No significant JVD. Abdomen: No distension, no tenderness, soft. Bowel sounds active.  Musculoskeletal: no clubbing / cyanosis. No joint deformity upper and lower extremities.   Skin: no significant rashes, lesions, ulcers. Warm, dry, well-perfused. Neurologic: CN 2-12 grossly intact. Moving all extremities. Alert and oriented.  Psychiatric: Pleasant. Cooperative.    Labs and Imaging on Admission: I have personally reviewed following labs and imaging studies  CBC: Recent Labs  Lab  12/24/21 2101  WBC 11.4*  HGB 15.5  HCT 47.4  MCV 88.3  PLT 409   Basic Metabolic Panel: Recent Labs  Lab 12/24/21 2101  NA 139  K 4.0  CL 98  CO2 29  GLUCOSE 118*  BUN 16  CREATININE 1.20  CALCIUM 9.8   GFR: Estimated Creatinine Clearance: 71.9 mL/min (by C-G formula based on SCr of 1.2 mg/dL). Liver Function Tests: No results for input(s): "AST", "ALT", "ALKPHOS", "BILITOT", "PROT", "ALBUMIN" in the last 168 hours. No results for input(s): "LIPASE", "AMYLASE" in the last 168 hours. No results for input(s): "AMMONIA" in the last 168 hours. Coagulation Profile: No results for input(s): "INR", "PROTIME" in the last 168 hours. Cardiac Enzymes: No results for input(s): "CKTOTAL", "CKMB", "CKMBINDEX", "TROPONINI" in the last 168 hours. BNP (last 3 results) No results for input(s): "PROBNP" in the last 8760 hours. HbA1C: No results for input(s): "HGBA1C" in the last 72 hours. CBG: No results for input(s): "GLUCAP" in the last 168 hours. Lipid Profile: No results for input(s): "CHOL", "HDL", "LDLCALC", "TRIG", "CHOLHDL", "LDLDIRECT" in the last 72 hours. Thyroid Function Tests: No results for input(s): "TSH", "T4TOTAL", "FREET4", "T3FREE", "THYROIDAB" in the last 72 hours. Anemia Panel: No results for input(s): "VITAMINB12", "FOLATE", "FERRITIN", "TIBC", "IRON", "RETICCTPCT" in the last 72  hours. Urine analysis: No results found for: "COLORURINE", "APPEARANCEUR", "LABSPEC", "PHURINE", "GLUCOSEU", "HGBUR", "BILIRUBINUR", "KETONESUR", "PROTEINUR", "UROBILINOGEN", "NITRITE", "LEUKOCYTESUR" Sepsis Labs: '@LABRCNTIP'$ (procalcitonin:4,lacticidven:4) )No results found for this or any previous visit (from the past 240 hour(s)).   Radiological Exams on Admission: CT Angio Chest PE W and/or Wo Contrast  Result Date: 12/25/2021 CLINICAL DATA:  Chest pain EXAM: CT ANGIOGRAPHY CHEST WITH CONTRAST TECHNIQUE: Multidetector CT imaging of the chest was performed using the standard protocol during bolus administration of intravenous contrast. Multiplanar CT image reconstructions and MIPs were obtained to evaluate the vascular anatomy. RADIATION DOSE REDUCTION: This exam was performed according to the departmental dose-optimization program which includes automated exposure control, adjustment of the mA and/or kV according to patient size and/or use of iterative reconstruction technique. CONTRAST:  56m OMNIPAQUE IOHEXOL 350 MG/ML SOLN COMPARISON:  Chest x-ray 12/24/2021 FINDINGS: Cardiovascular: Satisfactory opacification of the pulmonary arteries to the segmental level. Positive for acute right upper lobar, segmental and subsegmental PE. Smaller emboli present within right lower lobe segmental and subsegmental vessels. Small amount of acute embolus present within left lower lobe segmental and subsegmental vessels. No evidence for right heart strain. Cardiomegaly. No pericardial effusion. Mild aortic atherosclerosis. No aneurysm. Mediastinum/Nodes: Midline trachea. No thyroid mass. No suspicious lymph nodes. Esophagus within normal limits. Lungs/Pleura: Partial consolidation within the bilateral lower lobes. Heterogeneous peripheral appearing ground-glass density in the right upper lobe. Small right-sided pleural effusion. Upper Abdomen: No acute abnormality. Musculoskeletal: No chest wall abnormality. No acute  or significant osseous findings. Review of the MIP images confirms the above findings. IMPRESSION: 1. Findings consistent with acute right greater than left bilateral pulmonary emboli. No evidence for right heart strain. 2. Small right-sided pleural effusion. Bibasilar consolidations may be due to atelectasis or pneumonia. Heterogeneous subpleural ground-glass density in the right upper lobe, potentially due to infarct given its distribution with respect to right upper lobe thrombus though pneumonia could also produce this appearance. Critical Value/emergent results were called by telephone at the time of interpretation on 12/25/2021 at 2:02 am to [facility], who verbally acknowledged these results. Aortic Atherosclerosis (ICD10-I70.0). Electronically Signed   By: KDonavan FoilM.D.   On: 12/25/2021 02:02  DG Chest Port 1 View  Result Date: 12/24/2021 CLINICAL DATA:  Right-sided chest pain. EXAM: PORTABLE CHEST 1 VIEW COMPARISON:  Chest x-ray 04/13/2021 FINDINGS: The heart is enlarged. There are airspace opacities in the medial right lung base and inferior right upper lobe. There is no definite pleural effusion or pneumothorax. No acute fractures are seen. IMPRESSION: 1. Multifocal airspace disease in the right upper lobe and right lower lobe worrisome for pneumonia. Follow-up imaging recommended in 4-6 weeks to confirm resolution. Electronically Signed   By: Ronney Asters M.D.   On: 12/24/2021 23:32    EKG: Independently reviewed. SR, 1st deg AV block, chronic LBBB, QTc 509.   Assessment/Plan   1. Acute PE  - Presents with 4-5 days of right-sided pleuritic pain and mild SOB and found to have bilateral PE with likely infarct on right  - No evidence for heart strain on CT, troponin was normal x2, and he has been hemodynamically stable  - Started on IV heparin in ED  - Likely precipitated by long car ride 3 wks ago  - Check LE venous dopplers and echo, transition to Eliquis    2.  Hypertension  - Continue bisoprolol-HCTZ and ARB    3. Asthma; OSA  - Stable, not in exacerbation  - Continue CPAP qHS, Singulair, and as-needed albuterol  4. Hx of TIA  - Continue statin, recently taken off of Plavix, now starting Eliquis as above    5. Prolonged QT interval  - QTc is 509 ms in ED  - Check magnesium, avoid QT-prolonging medications     DVT prophylaxis: Eliquis  Code Status: Full  Level of Care:  Level of care: Telemetry Medical Family Communication: Niece and sister at bedside  Disposition Plan:  Patient is from: home  Anticipated d/c is to: home  Anticipated d/c date is: 12/27/21  Patient currently: pending transition to oral anticoagulant, home O2 or adequate oxygenation on rm air, LE venous dopplers, echo  Consults called: none  Admission status: Observation     Vianne Bulls, MD [facility]  12/25/2021, 9:58 PM

## 2021-12-25 NOTE — Progress Notes (Signed)
[facility] for heparin Indication: pulmonary embolus  No Known Allergies  Patient Measurements: Height: '6\' 2"'$  (188 cm) Weight: 131.5 kg (289 lb 14.5 oz) IBW/kg (Calculated) : 82.2 Heparin Dosing Weight: 110kg  Vital Signs: BP: 140/75 (07/10 1130) Pulse Rate: 79 (07/10 1130)  Labs: Recent Labs    12/24/21 2101 12/24/21 2257 12/25/21 1032  HGB 15.5  --   --   HCT 47.4  --   --   PLT 203  --   --   HEPARINUNFRC  --   --  0.67  CREATININE 1.20  --   --   TROPONINIHS 7 6  --      Estimated Creatinine Clearance: 71.9 mL/min (by C-G formula based on SCr of 1.2 mg/dL).  Assessment: 81yo male c/o CP that worsens with inspiration and certain movement of the shoulder, found to have a PE. Initial heparin level is therapeutic. No bleeding noted.   Goal of Therapy:  Heparin level 0.3-0.7 units/ml Monitor platelets by anticoagulation protocol: Yes   Plan:  Continue heparin gtt 1800 units/hr Recheck heparin level tonight to confirm dosing Daily heparin level and CBC  Salome Arnt, PharmD, BCPS Clinical Pharmacist Please see AMION for all pharmacy numbers 12/25/2021 12:36 PM

## 2021-12-25 NOTE — Progress Notes (Signed)
ANTICOAGULATION CONSULT NOTE - Initial Consult  Pharmacy Consult for heparin Indication: pulmonary embolus  No Known Allergies  Patient Measurements: Height: '6\' 2"'$  (188 cm) Weight: 131.5 kg (289 lb 14.5 oz) IBW/kg (Calculated) : 82.2 Heparin Dosing Weight: 110kg  Vital Signs: Temp: 97.6 F (36.4 C) (07/09 2054) BP: 146/75 (07/10 0200) Pulse Rate: 72 (07/10 0200)  Labs: Recent Labs    12/24/21 2101 12/24/21 2257  HGB 15.5  --   HCT 47.4  --   PLT 203  --   CREATININE 1.20  --   TROPONINIHS 7 6    Estimated Creatinine Clearance: 71.9 mL/min (by C-G formula based on SCr of 1.2 mg/dL).   Medical History: Past Medical History:  Diagnosis Date   Agent orange exposure    Asthma    Cancer ([place])    Depression    GERD (gastroesophageal reflux disease)    History of TIA (transient ischemic attack) OCTOBER 2012--  NO RESIDUAL   NEURO CONSULT IN FEB 2013   Hypertension    Prostate cancer ([place]) 04/12/10   gleason 3+3=6, volume 40 cc   Sinus abscess     Assessment: 81yo male c/o CP that worsens with inspiration and certain movement of the shoulder >> to begin heparin for PE.  Goal of Therapy:  Heparin level 0.3-0.7 units/ml Monitor platelets by anticoagulation protocol: Yes   Plan:  Heparin 5000 units IV bolus x1 followed by infusion at 1800 units/hr. Monitor heparin levels and CBC.  Wynona Neat, PharmD, BCPS  12/25/2021,2:15 AM

## 2021-12-25 NOTE — Progress Notes (Signed)
ANTICOAGULATION CONSULT NOTE  Pharmacy Consult for heparin Indication: pulmonary embolus  Allergies  Allergen Reactions   Aspartame And Phenylalanine Other (See Comments)    "Makes my head feel odd"   Lisinopril Cough    Patient Measurements: Height: '6\' 2"'$  (188 cm) Weight: 131.5 kg (289 lb 14.5 oz) IBW/kg (Calculated) : 82.2 Heparin Dosing Weight: 110kg  Vital Signs: Temp: 98.3 F (36.8 C) (07/10 1830) Temp Source: Oral (07/10 1830) BP: 124/93 (07/10 1830) Pulse Rate: 75 (07/10 1830)  Labs: Recent Labs    12/24/21 2101 12/24/21 2257 12/25/21 1032 12/25/21 1755  HGB 15.5  --   --   --   HCT 47.4  --   --   --   PLT 203  --   --   --   HEPARINUNFRC  --   --  0.67 0.30  CREATININE 1.20  --   --   --   TROPONINIHS 7 6  --   --      Estimated Creatinine Clearance: 71.9 mL/min (by C-G formula based on SCr of 1.2 mg/dL).  Assessment: 81yo male c/o CP that worsens with inspiration and certain movement of the shoulder, found to have a PE. Initial heparin level is therapeutic. No bleeding noted.   Today, 12/25/2021: Most recent heparin level borderline low on 1800 units/hr CBC WNL Transition to Eliquis per [facility] RN shut heparin drip off around 2100  Goal of Therapy:  Heparin level 0.3-0.7 units/ml Monitor platelets by anticoagulation protocol: Yes   Plan:  Start Eliquis 10 mg PO BID x 7d followed by 5 mg PO bid thereafter Pharmacy to provide Eliquis education and coupons prior to discharge  Reuel Boom, PharmD, BCPS [phone] 12/25/2021, 9:56 PM

## 2021-12-25 NOTE — Discharge Instructions (Addendum)
Information on my medicine - ELIQUIS (apixaban)  Why was Eliquis prescribed for you? Eliquis was prescribed to treat blood clots that may have been found in the veins of your legs (deep vein thrombosis) or in your lungs (pulmonary embolism) and to reduce the risk of them occurring again.  What do You need to know about Eliquis ? The starting dose is 10 mg (two 5 mg tablets) taken TWICE daily for the FIRST SEVEN (7) DAYS, then on (enter date)  01/02/22  the dose is reduced to ONE 5 mg tablet taken TWICE daily.  Eliquis may be taken with or without food.   Try to take the dose about the same time in the morning and in the evening. If you have difficulty swallowing the tablet whole please discuss with your pharmacist how to take the medication safely.  Take Eliquis exactly as prescribed and DO NOT stop taking Eliquis without talking to the doctor who prescribed the medication.  Stopping may increase your risk of developing a new blood clot.  Refill your prescription before you run out.  After discharge, you should have regular check-up appointments with your healthcare provider that is prescribing your Eliquis.    What do you do if you miss a dose? If a dose of ELIQUIS is not taken at the scheduled time, take it as soon as possible on the same day and twice-daily administration should be resumed. The dose should not be doubled to make up for a missed dose.  Important Safety Information A possible side effect of Eliquis is bleeding. You should call your healthcare provider right away if you experience any of the following: Bleeding from an injury or your nose that does not stop. Unusual colored urine (red or dark brown) or unusual colored stools (red or black). Unusual bruising for unknown reasons. A serious fall or if you hit your head (even if there is no bleeding).  Some medicines may interact with Eliquis and might increase your risk of bleeding or clotting while on Eliquis. To  help avoid this, consult your healthcare provider or pharmacist prior to using any new prescription or non-prescription medications, including herbals, vitamins, non-steroidal anti-inflammatory drugs (NSAIDs) and supplements.  This website has more information on Eliquis (apixaban): http://www.eliquis.com/eliquis/home

## 2021-12-25 NOTE — Plan of Care (Signed)
[facility] will assume care on arrival to accepting facility. Until arrival, care as per EDP. However, [facility] available 24/7 for questions and assistance.  Nursing staff, please page [facility] Admits and Consults ([phone]) as soon as the patient arrives to the hospital.   

## 2021-12-26 ENCOUNTER — Observation Stay (HOSPITAL_BASED_OUTPATIENT_CLINIC_OR_DEPARTMENT_OTHER): Payer: No Typology Code available for payment source

## 2021-12-26 ENCOUNTER — Other Ambulatory Visit (HOSPITAL_COMMUNITY): Payer: Self-pay

## 2021-12-26 ENCOUNTER — Telehealth (HOSPITAL_COMMUNITY): Payer: Self-pay | Admitting: Pharmacy Technician

## 2021-12-26 DIAGNOSIS — R0602 Shortness of breath: Secondary | ICD-10-CM | POA: Diagnosis not present

## 2021-12-26 DIAGNOSIS — I2699 Other pulmonary embolism without acute cor pulmonale: Secondary | ICD-10-CM

## 2021-12-26 DIAGNOSIS — I10 Essential (primary) hypertension: Secondary | ICD-10-CM | POA: Diagnosis not present

## 2021-12-26 DIAGNOSIS — I2694 Multiple subsegmental pulmonary emboli without acute cor pulmonale: Secondary | ICD-10-CM | POA: Diagnosis not present

## 2021-12-26 DIAGNOSIS — Z8546 Personal history of malignant neoplasm of prostate: Secondary | ICD-10-CM | POA: Diagnosis not present

## 2021-12-26 DIAGNOSIS — Z8673 Personal history of transient ischemic attack (TIA), and cerebral infarction without residual deficits: Secondary | ICD-10-CM | POA: Diagnosis not present

## 2021-12-26 LAB — ECHOCARDIOGRAM COMPLETE
AR max vel: 2.85 cm2
AV Area VTI: 2.85 cm2
AV Area mean vel: 3.04 cm2
AV Mean grad: 5 mmHg
AV Peak grad: 8.3 mmHg
Ao pk vel: 1.44 m/s
Area-P 1/2: 7.66 cm2
Calc EF: 43.1 %
Height: 74 in
MV M vel: 5.26 m/s
MV Peak grad: 110.7 mmHg
P 1/2 time: 765 msec
S' Lateral: 4.2 cm
Single Plane A2C EF: 42.1 %
Single Plane A4C EF: 41.2 %
Weight: 4638.48 oz

## 2021-12-26 LAB — CBC
HCT: 44.5 % (ref 39.0–52.0)
Hemoglobin: 14.2 g/dL (ref 13.0–17.0)
MCH: 28.6 pg (ref 26.0–34.0)
MCHC: 31.9 g/dL (ref 30.0–36.0)
MCV: 89.5 fL (ref 80.0–100.0)
Platelets: 199 10*3/uL (ref 150–400)
RBC: 4.97 MIL/uL (ref 4.22–5.81)
RDW: 14.5 % (ref 11.5–15.5)
WBC: 11.8 10*3/uL — ABNORMAL HIGH (ref 4.0–10.5)
nRBC: 0 % (ref 0.0–0.2)

## 2021-12-26 LAB — BASIC METABOLIC PANEL
Anion gap: 10 (ref 5–15)
BUN: 20 mg/dL (ref 8–23)
CO2: 27 mmol/L (ref 22–32)
Calcium: 8.9 mg/dL (ref 8.9–10.3)
Chloride: 103 mmol/L (ref 98–111)
Creatinine, Ser: 1.26 mg/dL — ABNORMAL HIGH (ref 0.61–1.24)
GFR, Estimated: 58 mL/min — ABNORMAL LOW (ref >60)
Glucose, Bld: 115 mg/dL — ABNORMAL HIGH (ref 70–99)
Potassium: 3.6 mmol/L (ref 3.5–5.1)
Sodium: 140 mmol/L (ref 135–145)

## 2021-12-26 MED ORDER — ORAL CARE MOUTH RINSE: 15.0000 mL | OROMUCOSAL | Status: DC | PRN

## 2021-12-26 MED ORDER — POTASSIUM CHLORIDE CRYS ER 20 MEQ PO TBCR
40.0000 meq | EXTENDED_RELEASE_TABLET | Freq: Once | ORAL | Status: AC
  Administered 2021-12-26: 40 meq via ORAL
  Filled 2021-12-26: qty 2

## 2021-12-26 MED ORDER — FAMOTIDINE 20 MG PO TABS
40.0000 mg | ORAL_TABLET | Freq: Every day | ORAL | Status: DC
  Administered 2021-12-26 – 2021-12-27 (×2): 40 mg via ORAL
  Filled 2021-12-26 (×2): qty 2

## 2021-12-26 NOTE — Progress Notes (Signed)
Bilateral lower extremity venous duplex completed. Refer to "CV Proc" under chart review to view preliminary results.  12/26/2021 9:46 AM Kelby Aline., MHA, RVT, RDCS, RDMS

## 2021-12-26 NOTE — Telephone Encounter (Signed)
Pharmacy Patient Advocate Encounter  Insurance verification completed.    The patient is insured through [employer] Part D   The patient is currently admitted and ran test claims for the following: Eliquis 5 mg.  Copays and coinsurance results were relayed to Inpatient clinical team.

## 2021-12-26 NOTE — Progress Notes (Signed)
Echocardiogram 2D Echocardiogram has been performed.  James Acevedo 12/26/2021, 10:09 AM

## 2021-12-26 NOTE — TOC Initial Note (Signed)
Transition of Care [facility]) - Initial/Assessment Note    Patient Details  Name: James Acevedo MRN: [id number] Date of Birth: 1940-11-08  [facility]) CM/SW Contact:    Leeroy Cha, RN Phone Number: 12/26/2021, 8:17 AM  Clinical Narrative:                  Transition of [facility]) Screening Note   Patient Details  Name: James Acevedo Date of Birth: Dec 20, 1940   Transition [facility]) CM/SW Contact:    Leeroy Cha, RN Phone Number: 12/26/2021, 8:17 AM    Transition of Care Department [facility]) has reviewed patient and no TOC needs have been identified at this time. We will continue to monitor patient advancement through interdisciplinary progression rounds. If new patient transition needs arise, please place a TOC consult.    Expected Discharge Plan: Home/Self Care Barriers to Discharge: Continued Medical Work up   Patient Goals and CMS Choice Patient states their goals for this hospitalization and ongoing recovery are:: to go home CMS Medicare.gov Compare Post Acute Care list provided to:: Patient Choice offered to / list presented to : Patient  Expected Discharge Plan and Services Expected Discharge Plan: Home/Self Care   Discharge Planning Services: CM Consult                                          Prior Living Arrangements/Services     Patient language and need for interpreter reviewed:: Yes Do you feel safe going back to the place where you live?: Yes            Criminal Activity/Legal Involvement Pertinent to Current Situation/Hospitalization: No - Comment as needed  Activities of Daily Living Home Assistive Devices/Equipment: CPAP, Eyeglasses ADL Screening (condition at time of admission) Patient's cognitive ability adequate to safely complete daily activities?: Yes Is the patient deaf or have difficulty hearing?: Yes Does the patient have difficulty seeing, even when wearing glasses/contacts?: No Does the patient  have difficulty concentrating, remembering, or making decisions?: No Patient able to express need for assistance with ADLs?: Yes Does the patient have difficulty dressing or bathing?: No Independently performs ADLs?: Yes (appropriate for developmental age) Does the patient have difficulty walking or climbing stairs?: No Weakness of Legs: None Weakness of Arms/Hands: Right  Permission Sought/Granted                  Emotional Assessment Appearance:: Appears stated age     Orientation: : Oriented to Self, Oriented to Place, Oriented to  Time, Oriented to Situation Alcohol / Substance Use: Not Applicable Psych Involvement: No (comment)  Admission diagnosis:  Pulmonary infarct ([place]) [I26.99] Acute pulmonary embolism ([place]) [I26.99] Multiple subsegmental pulmonary emboli without acute cor pulmonale ([facility]) [I26.94] Patient Active Problem List   Diagnosis Date Noted   Acute pulmonary embolism ([place]) 12/25/2021   Prolonged QT interval 12/25/2021   OSA on CPAP 12/25/2021   Asthma, chronic    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

## 2021-12-26 NOTE — Progress Notes (Signed)
PROGRESS NOTE    James Acevedo  WLN:[id number]  DOB: 10-17-1940  DOA: 12/24/2021 PCP: System, Provider Not In Outpatient Specialists:   Hospital course:  81 year old man with prostate cancer for asthma, OSA was admitted 12/25/2020 with pleuritic chest pain and found to have bilateral PE.  He was saturating low 90s on room air with normal heart rate and blood pressure.  CTA chest shows acute R>L bilateral pulmonary emboli without right heart strain.  Small RUL opacity suspicious for infarction.   Subjective:  Patient seen with twin brother and sister-in-law at bedside.  Patient states that he feels a little better than yesterday.  Thinks his cardiac chest pain is somewhat improved.  Is wondering about going by tomorrow.  Is not sure if exercise tolerance is improved, notes he has not walked around very much.  Discussed DVT with PE and etiology at length.  Patient admits that he does tend to sit around a fair bit.  Had previously been active.   Objective: Vitals:   12/25/21 2221 12/26/21 0243 12/26/21 0617 12/26/21 1254  BP: (!) 142/72 (!) 154/76 140/77 (!) 142/70  Pulse: 85 79 78 71  Resp: '20 20 17 '$ (!) 22  Temp: 98.8 F (37.1 C) 99.1 F (37.3 C) 98.5 F (36.9 C) 99 F (37.2 C)  TempSrc:    Oral  SpO2: 95% 94% 98% 95%  Weight:      Height:        Intake/Output Summary (Last 24 hours) at 12/26/2021 1522 Last data filed at 12/25/2021 2124 Gross per 24 hour  Intake 5 ml  Output --  Net 5 ml   Filed Weights   12/24/21 2054  Weight: 131.5 kg     Exam:  General: Pleasant patient sitting up in chair with attentive brother and sister-in-law at bedside. Eyes: sclera anicteric, conjuctiva mild injection bilaterally CVS: S1-S2, regular  Respiratory:  decreased air entry bilaterally secondary to decreased inspiratory effort, rales at bases  GI: NABS, soft, NT  LE: No edema.  Neuro: A/O x 3, Moving all extremities equally with normal strength, CN 3-12 intact, grossly  nonfocal.  Psych: patient is logical and coherent, judgement and insight appear normal, mood and affect appropriate to situation.   Assessment & Plan:   Acute PE, bilateral, possible RUL infarct Patient received 24 hours of IV heparin Now transitioned over to Eliquis Still having some pleuritic chest pain although improved some since admission Unclear if a 5-hour car ride 3 weeks ago it is enough to call this a provoked PE Lower extremity Dopplers are positive for DVT. Echocardiogram is ordered and pending Does not have an oxygen requirement  HTN Continue home meds  Visit/OSA CPAP and as needed inhaled bronchodilators  Prolonged QT QTc is 509 Potassium was 3.6, magnesium is 2.1 We we will supplement to get potassium greater than 4 Discontinue pantoprazole, can start omeprazole as an outpatient upon discharge Will add Pepcid 20 mg daily today Patient needs follow-up with PCP to check QTc after he has been off his pantoprazole  H/o TIA Had previously been on what sounds like Aggrenox Been changed to Plavix however he has not been taking it Patient to discuss addition of Plavix to Eliquis with his PCP after discharge   DVT prophylaxis: Eliquis Code Status: Full Family Communication: Mother and sister-in-law were at bedside Disposition Plan:   Patient is from: Home  Anticipated Discharge Location: Home  Barriers to Discharge: Prolonged QT, awaiting echo results  Is patient medically stable for Discharge:  Not yet   Scheduled Meds:  apixaban  10 mg Oral BID   Followed by   Derrill Memo ON 01/02/2022] apixaban  5 mg Oral BID   bisoprolol-hydrochlorothiazide  1 tablet Oral BID   darifenacin  7.5 mg Oral Daily   irbesartan  150 mg Oral Daily   levothyroxine  75 mcg Oral Q0600   montelukast  10 mg Oral QHS   pantoprazole  40 mg Oral BID   pravastatin  20 mg Oral QPM   sodium chloride flush  3 mL Intravenous Q12H   Continuous Infusions:  Data Reviewed:  Basic Metabolic  Panel: Recent Labs  Lab 12/24/21 2101 12/25/21 2153 12/26/21 0405  NA 139  --  140  K 4.0  --  3.6  CL 98  --  103  CO2 29  --  27  GLUCOSE 118*  --  115*  BUN 16  --  20  CREATININE 1.20  --  1.26*  CALCIUM 9.8  --  8.9  MG  --  2.1  --     CBC: Recent Labs  Lab 12/24/21 2101 12/26/21 0405  WBC 11.4* 11.8*  HGB 15.5 14.2  HCT 47.4 44.5  MCV 88.3 89.5  PLT 203 199    Studies: VAS [place] LOWER EXTREMITY VENOUS (DVT)  Result Date: 12/26/2021  Lower Venous DVT Study Patient Name:  KARDER GOODIN  Date of Exam:   12/26/2021 Medical Rec #: [id number]       Accession #:    [id number] Date of Birth: [birth year]       Patient Gender: M Patient Age:   61 years Exam Location:  [facility] Procedure:      VAS [place] LOWER EXTREMITY VENOUS (DVT) Referring Phys: Christia Reading OPYD --------------------------------------------------------------------------------  Indications: Pulmonary embolism.  Comparison Study: No prior study Performing Technologist: Maudry Mayhew MHA, RDMS, RVT, RDCS  Examination Guidelines: A complete evaluation includes B-mode imaging, spectral Doppler, color Doppler, and power Doppler as needed of all accessible portions of each vessel. Bilateral testing is considered an integral part of a complete examination. Limited examinations for reoccurring indications may be performed as noted. The reflux portion of the exam is performed with the patient in reverse Trendelenburg.  +---------+---------------+---------+-----------+----------+--------------+ RIGHT    CompressibilityPhasicitySpontaneityPropertiesThrombus Aging +---------+---------------+---------+-----------+----------+--------------+ CFV      Full           Yes      Yes                                 +---------+---------------+---------+-----------+----------+--------------+ SFJ      Full                                                         +---------+---------------+---------+-----------+----------+--------------+ FV Prox  Full                                                        +---------+---------------+---------+-----------+----------+--------------+ FV Mid   Full                                                        +---------+---------------+---------+-----------+----------+--------------+  FV DistalFull                                                        +---------+---------------+---------+-----------+----------+--------------+ PFV      Full                                                        +---------+---------------+---------+-----------+----------+--------------+ POP      None           Yes      Yes                  Acute          +---------+---------------+---------+-----------+----------+--------------+ PTV      None                    No                   Acute          +---------+---------------+---------+-----------+----------+--------------+ PERO     None                    No                   Acute          +---------+---------------+---------+-----------+----------+--------------+   +---------+---------------+---------+-----------+----------+--------------+ LEFT     CompressibilityPhasicitySpontaneityPropertiesThrombus Aging +---------+---------------+---------+-----------+----------+--------------+ CFV      Full           Yes      Yes                                 +---------+---------------+---------+-----------+----------+--------------+ SFJ      Full                                                        +---------+---------------+---------+-----------+----------+--------------+ FV Prox  Full                                                        +---------+---------------+---------+-----------+----------+--------------+ FV Mid   Full                                                         +---------+---------------+---------+-----------+----------+--------------+ FV DistalFull                                                        +---------+---------------+---------+-----------+----------+--------------+ PFV  Full                                                        +---------+---------------+---------+-----------+----------+--------------+ POP      Full           Yes      Yes                                 +---------+---------------+---------+-----------+----------+--------------+ PTV      Full                                                        +---------+---------------+---------+-----------+----------+--------------+ PERO     Full                                                        +---------+---------------+---------+-----------+----------+--------------+     Summary: RIGHT: - Findings consistent with acute deep vein thrombosis involving the right popliteal vein, right posterior tibial veins, and right peroneal veins. - No cystic structure found in the popliteal fossa.  LEFT: - There is no evidence of deep vein thrombosis in the lower extremity.  - No cystic structure found in the popliteal fossa.  *See table(s) above for measurements and observations. Electronically signed by Servando Snare MD on 12/26/2021 at 2:58:38 PM.    Final    ECHOCARDIOGRAM COMPLETE  Result Date: 12/26/2021    ECHOCARDIOGRAM REPORT   Patient Name:   DONYAE KILNER Date of Exam: 12/26/2021 Medical Rec #:  [id number]      Height:       74.0 in Accession #:    [id number]     Weight:       289.9 lb Date of Birth:  30-Oct-1940      BSA:          2.545 m Patient Age:    57 years       BP:           140/77 mmHg Patient Gender: M              HR:           68 bpm. Exam Location:  Inpatient Procedure: 2D Echo, Cardiac Doppler and Color Doppler Indications:    Pulmonary embolus  History:        Patient has no prior history of Echocardiogram examinations.                  Signs/Symptoms:Chest Pain; Risk Factors:Hypertension.  Sonographer:    Joette Catching RCS Referring Phys: [phone] [place]  1. Left ventricular ejection fraction, by estimation, is 40 to 45%. The left ventricle has mildly decreased function. The left ventricle demonstrates global hypokinesis. There is moderate concentric left ventricular hypertrophy. Left ventricular diastolic parameters are consistent with Grade I diastolic dysfunction (impaired relaxation).  2. Right ventricular systolic function is normal. The right ventricular size is normal. Mildly increased right ventricular wall thickness.  There is moderately elevated pulmonary artery systolic pressure. The estimated right ventricular systolic pressure  is 82.5 mmHg.  3. Left atrial size was moderately dilated.  4. The mitral valve is normal in structure. Mild mitral valve regurgitation.  5. Tricuspid valve regurgitation is mild to moderate.  6. The aortic valve is tricuspid. Aortic valve regurgitation is not visualized. Aortic valve sclerosis is present, with no evidence of aortic valve stenosis.  7. The inferior vena cava is dilated in size with >50% respiratory variability, suggesting right atrial pressure of 8 mmHg. FINDINGS  Left Ventricle: Left ventricular ejection fraction, by estimation, is 40 to 45%. The left ventricle has mildly decreased function. The left ventricle demonstrates global hypokinesis. The left ventricular internal cavity size was normal in size. There is  moderate concentric left ventricular hypertrophy. Abnormal (paradoxical) septal motion, consistent with left bundle branch block. Left ventricular diastolic parameters are consistent with Grade I diastolic dysfunction (impaired relaxation). Indeterminate filling pressures. Right Ventricle: The right ventricular size is normal. Mildly increased right ventricular wall thickness. Right ventricular systolic function is normal. There is moderately elevated pulmonary  artery systolic pressure. The tricuspid regurgitant velocity is 3.42 m/s, and with an assumed right atrial pressure of 8 mmHg, the estimated right ventricular systolic pressure is 05.3 mmHg. Left Atrium: Left atrial size was moderately dilated. Right Atrium: Right atrial size was normal in size. Pericardium: There is no evidence of pericardial effusion. Mitral Valve: The mitral valve is normal in structure. Mild mitral valve regurgitation. Tricuspid Valve: The tricuspid valve is normal in structure. Tricuspid valve regurgitation is mild to moderate. Aortic Valve: The aortic valve is tricuspid. Aortic valve regurgitation is not visualized. Aortic regurgitation PHT measures 765 msec. Aortic valve sclerosis is present, with no evidence of aortic valve stenosis. Aortic valve mean gradient measures 5.0 mmHg. Aortic valve peak gradient measures 8.3 mmHg. Aortic valve area, by VTI measures 2.85 cm. Pulmonic Valve: The pulmonic valve was normal in structure. Pulmonic valve regurgitation is mild. Aorta: The aortic root and ascending aorta are structurally normal, with no evidence of dilitation. Venous: The inferior vena cava is dilated in size with greater than 50% respiratory variability, suggesting right atrial pressure of 8 mmHg. IAS/Shunts: There is right bowing of the interatrial septum, suggestive of elevated left atrial pressure. No atrial level shunt detected by color flow Doppler.  LEFT VENTRICLE PLAX 2D LVIDd:         5.35 cm      Diastology LVIDs:         4.20 cm      LV e' medial:    4.90 cm/s LV PW:         1.50 cm      LV E/e' medial:  14.2 LV IVS:        1.55 cm      LV e' lateral:   6.20 cm/s LVOT diam:     2.20 cm      LV E/e' lateral: 11.2 LV SV:         84 LV SV Index:   33 LVOT Area:     3.80 cm  LV Volumes (MOD) LV vol d, MOD A2C: 115.0 ml LV vol d, MOD A4C: 162.0 ml LV vol s, MOD A2C: 66.6 ml LV vol s, MOD A4C: 95.3 ml LV SV MOD A2C:     48.4 ml LV SV MOD A4C:     162.0 ml LV SV MOD BP:      60.3 ml  RIGHT  VENTRICLE            IVC RV Basal diam:  3.30 cm    IVC diam: 2.20 cm RV Mid diam:    2.50 cm RV S prime:     9.79 cm/s TAPSE (M-mode): 1.8 cm LEFT ATRIUM             Index        RIGHT ATRIUM           Index LA diam:        4.20 cm 1.65 cm/m   RA Area:     10.80 cm LA Vol (A2C):   77.3 ml 30.37 ml/m  RA Volume:   21.30 ml  8.37 ml/m LA Vol (A4C):   70.0 ml 27.51 ml/m LA Biplane Vol: 76.2 ml 29.94 ml/m  AORTIC VALVE                     PULMONIC VALVE AV Area (Vmax):    2.85 cm      PV Vmax:          1.46 m/s AV Area (Vmean):   3.04 cm      PV Peak grad:     8.5 mmHg AV Area (VTI):     2.85 cm      PR End Diast Vel: 9.12 msec AV Vmax:           144.00 cm/s AV Vmean:          102.000 cm/s AV VTI:            0.293 m AV Peak Grad:      8.3 mmHg AV Mean Grad:      5.0 mmHg LVOT Vmax:         108.00 cm/s LVOT Vmean:        81.500 cm/s LVOT VTI:          0.220 m LVOT/AV VTI ratio: 0.75 AI PHT:            765 msec  AORTA Ao Root diam: 3.60 cm Ao Asc diam:  3.40 cm MITRAL VALVE               TRICUSPID VALVE MV Area (PHT): 7.66 cm    TR Peak grad:   46.8 mmHg MV Decel Time: 99 msec     TR Vmax:        342.00 cm/s MR Peak grad: 110.7 mmHg MR Mean grad: 66.0 mmHg    SHUNTS MR Vmax:      526.00 cm/s  Systemic VTI:  0.22 m MR Vmean:     386.0 cm/s   Systemic Diam: 2.20 cm MV E velocity: 69.40 cm/s MV A velocity: 93.00 cm/s MV E/A ratio:  0.75 Mihai Croitoru MD Electronically signed by Sanda Klein MD Signature Date/Time: 12/26/2021/12:23:32 PM    Final    CT Angio Chest PE W and/or Wo Contrast  Result Date: 12/25/2021 CLINICAL DATA:  Chest pain EXAM: CT ANGIOGRAPHY CHEST WITH CONTRAST TECHNIQUE: Multidetector CT imaging of the chest was performed using the standard protocol during bolus administration of intravenous contrast. Multiplanar CT image reconstructions and MIPs were obtained to evaluate the vascular anatomy. RADIATION DOSE REDUCTION: This exam was performed according to the departmental  dose-optimization program which includes automated exposure control, adjustment of the mA and/or kV according to patient size and/or use of iterative reconstruction technique. CONTRAST:  36m OMNIPAQUE IOHEXOL 350 MG/ML SOLN COMPARISON:  Chest x-ray 12/24/2021 FINDINGS: Cardiovascular: Satisfactory opacification  of the pulmonary arteries to the segmental level. Positive for acute right upper lobar, segmental and subsegmental PE. Smaller emboli present within right lower lobe segmental and subsegmental vessels. Small amount of acute embolus present within left lower lobe segmental and subsegmental vessels. No evidence for right heart strain. Cardiomegaly. No pericardial effusion. Mild aortic atherosclerosis. No aneurysm. Mediastinum/Nodes: Midline trachea. No thyroid mass. No suspicious lymph nodes. Esophagus within normal limits. Lungs/Pleura: Partial consolidation within the bilateral lower lobes. Heterogeneous peripheral appearing ground-glass density in the right upper lobe. Small right-sided pleural effusion. Upper Abdomen: No acute abnormality. Musculoskeletal: No chest wall abnormality. No acute or significant osseous findings. Review of the MIP images confirms the above findings. IMPRESSION: 1. Findings consistent with acute right greater than left bilateral pulmonary emboli. No evidence for right heart strain. 2. Small right-sided pleural effusion. Bibasilar consolidations may be due to atelectasis or pneumonia. Heterogeneous subpleural ground-glass density in the right upper lobe, potentially due to infarct given its distribution with respect to right upper lobe thrombus though pneumonia could also produce this appearance. Critical Value/emergent results were called by telephone at the time of interpretation on 12/25/2021 at 2:02 am to provider [facility] , who verbally acknowledged these results. Aortic Atherosclerosis (ICD10-I70.0). Electronically Signed   By: Donavan Foil M.D.   On: 12/25/2021 02:02    DG Chest Port 1 View  Result Date: 12/24/2021 CLINICAL DATA:  Right-sided chest pain. EXAM: PORTABLE CHEST 1 VIEW COMPARISON:  Chest x-ray 04/13/2021 FINDINGS: The heart is enlarged. There are airspace opacities in the medial right lung base and inferior right upper lobe. There is no definite pleural effusion or pneumothorax. No acute fractures are seen. IMPRESSION: 1. Multifocal airspace disease in the right upper lobe and right lower lobe worrisome for pneumonia. Follow-up imaging recommended in 4-6 weeks to confirm resolution. Electronically Signed   By: Ronney Asters M.D.   On: 12/24/2021 23:32    Principal Problem:   Acute pulmonary embolism ([place]) Active Problems:   Prostate cancer ([place])   Hypertension   History of TIA (transient ischemic attack)   Prolonged QT interval   Asthma, chronic   OSA on CPAP     Harvel Meskill Tublu Xitlali Kastens, [facility]  If 7PM-7AM, please contact night-coverage [email or web address]   LOS: 0 days

## 2021-12-26 NOTE — TOC Benefit Eligibility Note (Signed)
Patient Teacher, English as a foreign language completed.    The patient is currently admitted and upon discharge could be taking Eliquis 5 mg.  The current 30 day co-pay is, $45.00.   The patient is insured through [employer], [place] Patient Advocate Specialist [place] Patient Advocate Team Direct Number: [phone]  Fax: [phone]

## 2021-12-27 ENCOUNTER — Other Ambulatory Visit (HOSPITAL_COMMUNITY): Payer: Self-pay

## 2021-12-27 DIAGNOSIS — I2699 Other pulmonary embolism without acute cor pulmonale: Secondary | ICD-10-CM | POA: Diagnosis not present

## 2021-12-27 LAB — CBC
HCT: 48.4 % (ref 39.0–52.0)
Hemoglobin: 15.5 g/dL (ref 13.0–17.0)
MCH: 28.5 pg (ref 26.0–34.0)
MCHC: 32 g/dL (ref 30.0–36.0)
MCV: 89 fL (ref 80.0–100.0)
Platelets: 245 10*3/uL (ref 150–400)
RBC: 5.44 MIL/uL (ref 4.22–5.81)
RDW: 14.2 % (ref 11.5–15.5)
WBC: 15.9 10*3/uL — ABNORMAL HIGH (ref 4.0–10.5)
nRBC: 0 % (ref 0.0–0.2)

## 2021-12-27 LAB — BASIC METABOLIC PANEL
Anion gap: 9 (ref 5–15)
BUN: 19 mg/dL (ref 8–23)
CO2: 23 mmol/L (ref 22–32)
Calcium: 9 mg/dL (ref 8.9–10.3)
Chloride: 104 mmol/L (ref 98–111)
Creatinine, Ser: 1.19 mg/dL (ref 0.61–1.24)
GFR, Estimated: >60 mL/min (ref >60)
Glucose, Bld: 118 mg/dL — ABNORMAL HIGH (ref 70–99)
Potassium: 4.3 mmol/L (ref 3.5–5.1)
Sodium: 136 mmol/L (ref 135–145)

## 2021-12-27 MED ORDER — OMEPRAZOLE 20 MG PO CPDR
20.0000 mg | DELAYED_RELEASE_CAPSULE | Freq: Two times a day (BID) | ORAL | 0 refills | Status: AC
  Filled 2021-12-27: qty 28, 14d supply, fill #0

## 2021-12-27 MED ORDER — APIXABAN 5 MG PO TABS
10.0000 mg | ORAL_TABLET | Freq: Two times a day (BID) | ORAL | 0 refills | Status: AC
  Filled 2021-12-27: qty 22, 11d supply, fill #0

## 2021-12-27 MED ORDER — APIXABAN 5 MG PO TABS
5.0000 mg | ORAL_TABLET | Freq: Two times a day (BID) | ORAL | 0 refills | Status: AC
  Filled 2021-12-27: qty 60, 30d supply, fill #0

## 2021-12-27 NOTE — Progress Notes (Signed)
Per pharmacy request, MD paged to change home Rx form for insurance purposes. Awaiting reply.  Angie Fava, RN

## 2021-12-27 NOTE — Plan of Care (Signed)

## 2021-12-27 NOTE — TOC Transition Note (Signed)
Transition of Care [facility]) - CM/SW Discharge Note   Patient Details  Name: James Acevedo MRN: [id number] Date of Birth: 11-Sep-1940  Transition of Care [facility]) CM/SW Contact:  Leeroy Cha, RN Phone Number: 12/27/2021, 1:35 PM   Clinical Narrative:    Dcd to home with family no toc needs present.   Final next level of care: Home/Self Care Barriers to Discharge: Barriers Resolved   Patient Goals and CMS Choice Patient states their goals for this hospitalization and ongoing recovery are:: to go home CMS Medicare.gov Compare Post Acute Care list provided to:: Patient Choice offered to / list presented to : Patient  Discharge Placement                       Discharge Plan and Services   Discharge Planning Services: CM Consult                                 Social Determinants of Health (SDOH) Interventions     Readmission Risk Interventions     No data to display

## 2021-12-27 NOTE — Progress Notes (Signed)
Discharge instructions provided to and reviewed with patient.  Patient verbalized understanding.  Medications delivered to bedside by [facility] outpatient pharmacy.  PIV and cardiac monitoring removed.  Patient transported to main entrance via wheelchair with belongings for transport home with significant other.  Angie Fava, RN

## 2021-12-27 NOTE — Discharge Summary (Signed)
[name]:[id number] DOB: 1941/02/24 DOA: 12/24/2021  PCP: System, Provider Not In  Admit date: 12/24/2021  Discharge date: 12/27/2021  Admitted From: home  Disposition:  home   Recommendations for Outpatient Follow-up:   Follow up with PCP in 1 week  PCP:   Patient will need further prescription for Eliquis for bilateral PE.  Please also check EKG for QTc after discontinuation of pantoprazole.  Home Health: none   Equipment/Devices: na  Consultations: none  Discharge Condition: improved   CODE STATUS: full   Diet Recommendation: Heart Healthy   Diet Order             Diet - low sodium heart healthy           Diet regular Room service appropriate? Yes; Fluid consistency: Thin  Diet effective now                    Chief Complaint  Patient presents with   Chest Pain     Brief history of present illness from the day of admission and additional interim summary     James Acevedo is a pleasant 81 y.o. male with medical history significant for prostate cancer, asthma, OSA on CPAP, hypertension, and hypothyroidism, presenting to the emergency department with right sided chest pain.  Patient reports 4 to 5 days of pain in the right lateral chest that has been worse with deep breath.  He has had mild dyspnea associated with this, but no significant cough.  He denies any leg swelling or tenderness.  He denies any personal or family history of VTE.  He went on a cruise 3 weeks ago and rode in a car for 5 hours to get to the port.  No history of bleeding problems.   Upon arrival to the ED, patient is found to be afebrile and saturating low 90s on room air with normal heart rate and blood pressure.  EKG features sinus rhythm with first-degree AV nodal block, chronic LBBB, and QTc interval 509 ms.  D-dimer  was elevated to 1.27.  Troponin was normal x2.  CTA chest reveals acute right greater than left pulmonary emboli without heart strain, small right pleural effusion, and right upper lobe opacity suspicious for infarction.                                                                 Hospital Course   Acute PE, bilateral, possible RUL infarct Patient received 24 hours of IV heparin Discharged home on Eliquis to follow-up with PCP Pleuritic chest pain improved since admission  Unclear if a 5-hour car ride 3 weeks ago it is enough to call this a provoked PE Lower extremity Dopplers are positive for DVT. Does not have an oxygen requirement   HTN Continue home meds  Visit/OSA CPAP and as needed inhaled bronchodilators   Minimally prolonged QT at 509 Potassium was 3.6, magnesium is 2.1 Discontinue pantoprazole, can start omeprazole as an outpatient upon discharge Patient needs follow-up with PCP in 1 week to check QTc after he has been off his pantoprazole   H/o TIA Had previously been on what sounds like Aggrenox Been changed to Plavix however he has not been taking it Patient to discuss addition of Plavix to Eliquis with his PCP after discharge      Discharge diagnosis     Principal Problem:   Acute pulmonary embolism ([place]) Active Problems:   Prostate cancer ([place])   Hypertension   History of TIA (transient ischemic attack)   Prolonged QT interval   Asthma, chronic   OSA on CPAP    Discharge instructions    Discharge Instructions     Diet - low sodium heart healthy   Complete by: As directed    Discharge instructions   Complete by: As directed    1. Please make sure you understand how to take your blood thinner Eliquis  2. Make an appointment to see your PCP in 1 week. I have given you a one month supply of Eliquis but they will need to continue it and decide how long you need to be on it (3 or 6 months). 3. The pain you have with deep breath or cough should get  better day by day.  4. If you develop increasing difficulty breathing or fevers/chills or new cough with lots of phlegm, you need to be seen to make sure you have not developed a pneumonia.   Increase activity slowly   Complete by: As directed        Discharge Medications   Allergies as of 12/27/2021       Reactions   Aspartame And Phenylalanine Other (See Comments)   "Makes my head feel odd"   Lisinopril Cough        Medication List     STOP taking these medications    clopidogrel 75 MG tablet Commonly known as: PLAVIX   doxycycline 100 MG capsule Commonly known as: VIBRAMYCIN   pantoprazole 40 MG tablet Commonly known as: PROTONIX   ranitidine 300 MG tablet Commonly known as: ZANTAC       TAKE these medications    albuterol 108 (90 Base) MCG/ACT inhaler Commonly known as: VENTOLIN HFA Inhale 2 puffs into the lungs every 4 (four) hours as needed for wheezing or shortness of breath.   bisoprolol-hydrochlorothiazide 5-6.25 MG tablet Commonly known as: ZIAC Take 1 tablet by mouth 2 (two) times daily.   Eliquis 5 MG Tabs tablet Generic drug: apixaban Take 2 tablets (10 mg total) by mouth 2 times daily for 11 doses.   Eliquis 5 MG Tabs tablet Generic drug: apixaban Take 1 tablet (5 mg total) by mouth 2 (two) times daily after finishing loading dose. Start taking on: January 02, 2022   fluticasone 50 MCG/ACT nasal spray Commonly known as: FLONASE Place 1 spray into both nostrils daily as needed for allergies or rhinitis.   irbesartan 150 MG tablet Commonly known as: AVAPRO Take 150 mg by mouth daily.   levothyroxine 75 MCG tablet Commonly known as: SYNTHROID Take 75 mcg by mouth daily before breakfast.   loratadine 10 MG tablet Commonly known as: CLARITIN Take 10 mg by mouth at bedtime.   mometasone 220 MCG/INH inhaler Commonly known as: ASMANEX Inhale 1 puff into the lungs 2 (two) times daily.  montelukast 10 MG tablet Commonly known as:  SINGULAIR Take 1 tablet (10 mg total) by mouth at bedtime.   multivitamin tablet Take 1 tablet by mouth daily with breakfast.   omeprazole 20 MG tablet Commonly known as: PriLOSEC OTC Take 1 tablet (20 mg total) by mouth 2 (two) times daily.   pravastatin 20 MG tablet Commonly known as: PRAVACHOL Take 20 mg by mouth every evening.   solifenacin 5 MG tablet Commonly known as: VESICARE Take 5 mg by mouth at bedtime.   VITAMIN D3 PO Take 1 capsule by mouth daily.          Major procedures and Radiology Reports - PLEASE review detailed and final reports thoroughly  -      VAS [place] LOWER EXTREMITY VENOUS (DVT)  Result Date: 12/26/2021  Lower Venous DVT Study Patient Name:  VA BROADWELL  Date of Exam:   12/26/2021 Medical Rec #: [id number]       Accession #:    [id number] Date of Birth: July 12, 1940       Patient Gender: M Patient Age:   54 years Exam Location:  [facility] Procedure:      VAS [place] LOWER EXTREMITY VENOUS (DVT) Referring Phys: Christia Reading OPYD --------------------------------------------------------------------------------  Indications: Pulmonary embolism.  Comparison Study: No prior study Performing Technologist: Maudry Mayhew MHA, RDMS, RVT, RDCS  Examination Guidelines: A complete evaluation includes B-mode imaging, spectral Doppler, color Doppler, and power Doppler as needed of all accessible portions of each vessel. Bilateral testing is considered an integral part of a complete examination. Limited examinations for reoccurring indications may be performed as noted. The reflux portion of the exam is performed with the patient in reverse Trendelenburg.  +---------+---------------+---------+-----------+----------+--------------+ RIGHT    CompressibilityPhasicitySpontaneityPropertiesThrombus Aging +---------+---------------+---------+-----------+----------+--------------+ CFV      Full           Yes      Yes                                  +---------+---------------+---------+-----------+----------+--------------+ SFJ      Full                                                        +---------+---------------+---------+-----------+----------+--------------+ FV Prox  Full                                                        +---------+---------------+---------+-----------+----------+--------------+ FV Mid   Full                                                        +---------+---------------+---------+-----------+----------+--------------+ FV DistalFull                                                        +---------+---------------+---------+-----------+----------+--------------+  PFV      Full                                                        +---------+---------------+---------+-----------+----------+--------------+ POP      None           Yes      Yes                  Acute          +---------+---------------+---------+-----------+----------+--------------+ PTV      None                    No                   Acute          +---------+---------------+---------+-----------+----------+--------------+ PERO     None                    No                   Acute          +---------+---------------+---------+-----------+----------+--------------+   +---------+---------------+---------+-----------+----------+--------------+ LEFT     CompressibilityPhasicitySpontaneityPropertiesThrombus Aging +---------+---------------+---------+-----------+----------+--------------+ CFV      Full           Yes      Yes                                 +---------+---------------+---------+-----------+----------+--------------+ SFJ      Full                                                        +---------+---------------+---------+-----------+----------+--------------+ FV Prox  Full                                                         +---------+---------------+---------+-----------+----------+--------------+ FV Mid   Full                                                        +---------+---------------+---------+-----------+----------+--------------+ FV DistalFull                                                        +---------+---------------+---------+-----------+----------+--------------+ PFV      Full                                                        +---------+---------------+---------+-----------+----------+--------------+  POP      Full           Yes      Yes                                 +---------+---------------+---------+-----------+----------+--------------+ PTV      Full                                                        +---------+---------------+---------+-----------+----------+--------------+ PERO     Full                                                        +---------+---------------+---------+-----------+----------+--------------+     Summary: RIGHT: - Findings consistent with acute deep vein thrombosis involving the right popliteal vein, right posterior tibial veins, and right peroneal veins. - No cystic structure found in the popliteal fossa.  LEFT: - There is no evidence of deep vein thrombosis in the lower extremity.  - No cystic structure found in the popliteal fossa.  *See table(s) above for measurements and observations. Electronically signed by Servando Snare MD on 12/26/2021 at 2:58:38 PM.    Final    ECHOCARDIOGRAM COMPLETE  Result Date: 12/26/2021    ECHOCARDIOGRAM REPORT   Patient Name:   HOMAR WEINKAUF Date of Exam: 12/26/2021 Medical Rec #:  [id number]      Height:       74.0 in Accession #:    [id number]     Weight:       289.9 lb Date of Birth:  09/19/1940      BSA:          2.545 m Patient Age:    62 years       BP:           140/77 mmHg Patient Gender: M              HR:           68 bpm. Exam Location:  Inpatient Procedure: 2D Echo, Cardiac Doppler and  Color Doppler Indications:    Pulmonary embolus  History:        Patient has no prior history of Echocardiogram examinations.                 Signs/Symptoms:Chest Pain; Risk Factors:Hypertension.  Sonographer:    Joette Catching RCS Referring Phys: [phone] [place]  1. Left ventricular ejection fraction, by estimation, is 40 to 45%. The left ventricle has mildly decreased function. The left ventricle demonstrates global hypokinesis. There is moderate concentric left ventricular hypertrophy. Left ventricular diastolic parameters are consistent with Grade I diastolic dysfunction (impaired relaxation).  2. Right ventricular systolic function is normal. The right ventricular size is normal. Mildly increased right ventricular wall thickness. There is moderately elevated pulmonary artery systolic pressure. The estimated right ventricular systolic pressure  is 77.9 mmHg.  3. Left atrial size was moderately dilated.  4. The mitral valve is normal in structure. Mild mitral valve regurgitation.  5. Tricuspid valve regurgitation is mild to moderate.  6. The aortic valve is tricuspid. Aortic valve  regurgitation is not visualized. Aortic valve sclerosis is present, with no evidence of aortic valve stenosis.  7. The inferior vena cava is dilated in size with >50% respiratory variability, suggesting right atrial pressure of 8 mmHg. FINDINGS  Left Ventricle: Left ventricular ejection fraction, by estimation, is 40 to 45%. The left ventricle has mildly decreased function. The left ventricle demonstrates global hypokinesis. The left ventricular internal cavity size was normal in size. There is  moderate concentric left ventricular hypertrophy. Abnormal (paradoxical) septal motion, consistent with left bundle branch block. Left ventricular diastolic parameters are consistent with Grade I diastolic dysfunction (impaired relaxation). Indeterminate filling pressures. Right Ventricle: The right ventricular size is  normal. Mildly increased right ventricular wall thickness. Right ventricular systolic function is normal. There is moderately elevated pulmonary artery systolic pressure. The tricuspid regurgitant velocity is 3.42 m/s, and with an assumed right atrial pressure of 8 mmHg, the estimated right ventricular systolic pressure is 29.9 mmHg. Left Atrium: Left atrial size was moderately dilated. Right Atrium: Right atrial size was normal in size. Pericardium: There is no evidence of pericardial effusion. Mitral Valve: The mitral valve is normal in structure. Mild mitral valve regurgitation. Tricuspid Valve: The tricuspid valve is normal in structure. Tricuspid valve regurgitation is mild to moderate. Aortic Valve: The aortic valve is tricuspid. Aortic valve regurgitation is not visualized. Aortic regurgitation PHT measures 765 msec. Aortic valve sclerosis is present, with no evidence of aortic valve stenosis. Aortic valve mean gradient measures 5.0 mmHg. Aortic valve peak gradient measures 8.3 mmHg. Aortic valve area, by VTI measures 2.85 cm. Pulmonic Valve: The pulmonic valve was normal in structure. Pulmonic valve regurgitation is mild. Aorta: The aortic root and ascending aorta are structurally normal, with no evidence of dilitation. Venous: The inferior vena cava is dilated in size with greater than 50% respiratory variability, suggesting right atrial pressure of 8 mmHg. IAS/Shunts: There is right bowing of the interatrial septum, suggestive of elevated left atrial pressure. No atrial level shunt detected by color flow Doppler.  LEFT VENTRICLE PLAX 2D LVIDd:         5.35 cm      Diastology LVIDs:         4.20 cm      LV e' medial:    4.90 cm/s LV PW:         1.50 cm      LV E/e' medial:  14.2 LV IVS:        1.55 cm      LV e' lateral:   6.20 cm/s LVOT diam:     2.20 cm      LV E/e' lateral: 11.2 LV SV:         84 LV SV Index:   33 LVOT Area:     3.80 cm  LV Volumes (MOD) LV vol d, MOD A2C: 115.0 ml LV vol d, MOD A4C:  162.0 ml LV vol s, MOD A2C: 66.6 ml LV vol s, MOD A4C: 95.3 ml LV SV MOD A2C:     48.4 ml LV SV MOD A4C:     162.0 ml LV SV MOD BP:      60.3 ml RIGHT VENTRICLE            IVC RV Basal diam:  3.30 cm    IVC diam: 2.20 cm RV Mid diam:    2.50 cm RV S prime:     9.79 cm/s TAPSE (M-mode): 1.8 cm LEFT ATRIUM  Index        RIGHT ATRIUM           Index LA diam:        4.20 cm 1.65 cm/m   RA Area:     10.80 cm LA Vol (A2C):   77.3 ml 30.37 ml/m  RA Volume:   21.30 ml  8.37 ml/m LA Vol (A4C):   70.0 ml 27.51 ml/m LA Biplane Vol: 76.2 ml 29.94 ml/m  AORTIC VALVE                     PULMONIC VALVE AV Area (Vmax):    2.85 cm      PV Vmax:          1.46 m/s AV Area (Vmean):   3.04 cm      PV Peak grad:     8.5 mmHg AV Area (VTI):     2.85 cm      PR End Diast Vel: 9.12 msec AV Vmax:           144.00 cm/s AV Vmean:          102.000 cm/s AV VTI:            0.293 m AV Peak Grad:      8.3 mmHg AV Mean Grad:      5.0 mmHg LVOT Vmax:         108.00 cm/s LVOT Vmean:        81.500 cm/s LVOT VTI:          0.220 m LVOT/AV VTI ratio: 0.75 AI PHT:            765 msec  AORTA Ao Root diam: 3.60 cm Ao Asc diam:  3.40 cm MITRAL VALVE               TRICUSPID VALVE MV Area (PHT): 7.66 cm    TR Peak grad:   46.8 mmHg MV Decel Time: 99 msec     TR Vmax:        342.00 cm/s MR Peak grad: 110.7 mmHg MR Mean grad: 66.0 mmHg    SHUNTS MR Vmax:      526.00 cm/s  Systemic VTI:  0.22 m MR Vmean:     386.0 cm/s   Systemic Diam: 2.20 cm MV E velocity: 69.40 cm/s MV A velocity: 93.00 cm/s MV E/A ratio:  0.75 Mihai Croitoru MD Electronically signed by Sanda Klein MD Signature Date/Time: 12/26/2021/12:23:32 PM    Final    CT Angio Chest PE W and/or Wo Contrast  Result Date: 12/25/2021 CLINICAL DATA:  Chest pain EXAM: CT ANGIOGRAPHY CHEST WITH CONTRAST TECHNIQUE: Multidetector CT imaging of the chest was performed using the standard protocol during bolus administration of intravenous contrast. Multiplanar CT image reconstructions and  MIPs were obtained to evaluate the vascular anatomy. RADIATION DOSE REDUCTION: This exam was performed according to the departmental dose-optimization program which includes automated exposure control, adjustment of the mA and/or kV according to patient size and/or use of iterative reconstruction technique. CONTRAST:  36m OMNIPAQUE IOHEXOL 350 MG/ML SOLN COMPARISON:  Chest x-ray 12/24/2021 FINDINGS: Cardiovascular: Satisfactory opacification of the pulmonary arteries to the segmental level. Positive for acute right upper lobar, segmental and subsegmental PE. Smaller emboli present within right lower lobe segmental and subsegmental vessels. Small amount of acute embolus present within left lower lobe segmental and subsegmental vessels. No evidence for right heart strain. Cardiomegaly. No pericardial effusion. Mild aortic atherosclerosis. No aneurysm. Mediastinum/Nodes: Midline trachea.  No thyroid mass. No suspicious lymph nodes. Esophagus within normal limits. Lungs/Pleura: Partial consolidation within the bilateral lower lobes. Heterogeneous peripheral appearing ground-glass density in the right upper lobe. Small right-sided pleural effusion. Upper Abdomen: No acute abnormality. Musculoskeletal: No chest wall abnormality. No acute or significant osseous findings. Review of the MIP images confirms the above findings. IMPRESSION: 1. Findings consistent with acute right greater than left bilateral pulmonary emboli. No evidence for right heart strain. 2. Small right-sided pleural effusion. Bibasilar consolidations may be due to atelectasis or pneumonia. Heterogeneous subpleural ground-glass density in the right upper lobe, potentially due to infarct given its distribution with respect to right upper lobe thrombus though pneumonia could also produce this appearance. Critical Value/emergent results were called by telephone at the time of interpretation on 12/25/2021 at 2:02 am to provider [facility] , who verbally  acknowledged these results. Aortic Atherosclerosis (ICD10-I70.0). Electronically Signed   By: Donavan Foil M.D.   On: 12/25/2021 02:02   DG Chest Port 1 View  Result Date: 12/24/2021 CLINICAL DATA:  Right-sided chest pain. EXAM: PORTABLE CHEST 1 VIEW COMPARISON:  Chest x-ray 04/13/2021 FINDINGS: The heart is enlarged. There are airspace opacities in the medial right lung base and inferior right upper lobe. There is no definite pleural effusion or pneumothorax. No acute fractures are seen. IMPRESSION: 1. Multifocal airspace disease in the right upper lobe and right lower lobe worrisome for pneumonia. Follow-up imaging recommended in 4-6 weeks to confirm resolution. Electronically Signed   By: Ronney Asters M.D.   On: 12/24/2021 23:32    Micro Results   No results found for this or any previous visit (from the past 240 hour(s)).  Today   Subjective    [facility] feels much improved since admission.  Feels ready to go home.  Denies chest pain, shortness of breath or abdominal pain.  Feels they can take care of themselves with the resources they have at home.  Objective   Blood pressure 115/84, pulse 64, temperature 98.7 F (37.1 C), temperature source Oral, resp. rate 18, height '6\' 2"'$  (1.88 m), weight 131.5 kg, SpO2 96 %.   Intake/Output Summary (Last 24 hours) at 12/27/2021 1626 Last data filed at 12/27/2021 0900 Gross per 24 hour  Intake 240 ml  Output --  Net 240 ml    Exam General: Patient appears well and in good spirits sitting up in bed in no acute distress.  Eyes: sclera anicteric, conjuctiva mild injection bilaterally CVS: S1-S2, regular  Respiratory:  decreased air entry bilaterally secondary to decreased inspiratory effort, rales at bases  GI: NABS, soft, NT  LE: No edema.  Neuro: A/O x 3, Moving all extremities equally with normal strength, CN 3-12 intact, grossly nonfocal.  Psych: patient is logical and coherent, judgement and insight appear normal, mood and affect  appropriate to situation.    Data Review   CBC w Diff:  Lab Results  Component Value Date   WBC 15.9 (H) 12/27/2021   HGB 15.5 12/27/2021   HCT 48.4 12/27/2021   PLT 245 12/27/2021    CMP:  Lab Results  Component Value Date   NA 136 12/27/2021   K 4.3 12/27/2021   CL 104 12/27/2021   CO2 23 12/27/2021   BUN 19 12/27/2021   CREATININE 1.19 12/27/2021   PROT 6.7 11/19/2011   ALBUMIN 3.6 11/19/2011   BILITOT 0.5 11/19/2011   ALKPHOS 86 11/19/2011   AST 21 11/19/2011   ALT 21 11/19/2011  .   Total  Time in preparing paper work, data evaluation and todays exam - 35 minutes  [name] M.D on 12/27/2021 at 4:26 PM  [facility]

## 2022-01-30 ENCOUNTER — Telehealth (HOSPITAL_COMMUNITY): Payer: Self-pay

## 2022-01-30 NOTE — Telephone Encounter (Signed)
Transitions of Care Pharmacy  ° °Call attempted for a pharmacy transitions of care follow-up. HIPAA appropriate voicemail was left with call back information provided.  ° °Call attempt #1. Will follow-up in 2-3 days.  °  °

## 2022-02-01 ENCOUNTER — Telehealth (HOSPITAL_COMMUNITY): Payer: Self-pay

## 2022-02-01 NOTE — Telephone Encounter (Signed)
Pharmacy Transitions of Care Follow-up Telephone Call  Date of discharge: 12/27/2021  Discharge Diagnosis: PE  How have you been since you were released from the hospital? Patient has been doing well since discharge. He has no questions or concerns regarding his medications.    Medication changes made at discharge:  START taking: Eliquis (apixaban)  omeprazole (PriLOSEC OTC)   STOP taking: clopidogrel 75 MG tablet (PLAVIX)  doxycycline 100 MG capsule (VIBRAMYCIN)  pantoprazole 40 MG tablet (PROTONIX)  ranitidine 300 MG tablet (ZANTAC)  Medication changes verified by the patient? Yes    Medication Accessibility:  Home Pharmacy: [facility]   Was the patient provided with refills on discharged medications? No   Have all prescriptions been transferred from [facility] to home pharmacy? Yes   Is the patient interested in using a [place]? No  Is the patient able to afford medications? Yes  Referred patient to patient care advocate for medication assistance? No  Medication Review:   APIXABAN (ELIQUIS)  Apixaban 5 mg BID  - Discussed importance of taking medication around the same time every day.  - Advised patient of medications to avoid (NSAIDs, ASA maintenance doses>100 mg daily)  - Educated that Tylenol (acetaminophen) will be the preferred analgesic to prevent risk of bleeding. - Emphasized importance of monitoring for signs and symptoms of bleeding (abnormal bruising, prolonged bleeding, nose bleeds, bleeding from gums, discolored urine, black tarry stools)  - Advised patient to alert all providers of anticoagulation therapy prior to starting a new medication or having a procedure.  Follow-up Appointments:  PCP Hospital f/u appt confirmed? Saw Avelina Laine on 01/16/2022.   If their condition worsens, is the pt aware to call PCP or go to the Emergency Dept.? Yes  Final Patient Assessment: Patient has had follow-up with the [facility] and has refills sent to home  pharmacy.

## 2022-02-22 ENCOUNTER — Telehealth: Payer: Self-pay | Admitting: Physician Assistant

## 2022-02-22 NOTE — Telephone Encounter (Signed)
Scheduled appt per 9/7 referral. Pt is aware of appt date and time. Pt is aware to arrive 15 mins prior to appt time and to bring and updated insurance card. Pt is aware of appt location.   

## 2022-02-23 ENCOUNTER — Other Ambulatory Visit (HOSPITAL_COMMUNITY): Payer: Self-pay

## 2022-03-02 ENCOUNTER — Emergency Department (HOSPITAL_BASED_OUTPATIENT_CLINIC_OR_DEPARTMENT_OTHER): Payer: Non-veteran care | Admitting: Radiology

## 2022-03-02 ENCOUNTER — Encounter (HOSPITAL_BASED_OUTPATIENT_CLINIC_OR_DEPARTMENT_OTHER): Payer: Self-pay

## 2022-03-02 ENCOUNTER — Emergency Department (HOSPITAL_BASED_OUTPATIENT_CLINIC_OR_DEPARTMENT_OTHER)
Admission: EM | Admit: 2022-03-02 | Discharge: 2022-03-02 | Disposition: A | Discharge status: Home / Self Care | Payer: Non-veteran care | Attending: Emergency Medicine | Admitting: Emergency Medicine

## 2022-03-02 ENCOUNTER — Other Ambulatory Visit: Payer: Self-pay

## 2022-03-02 DIAGNOSIS — Z8546 Personal history of malignant neoplasm of prostate: Secondary | ICD-10-CM | POA: Insufficient documentation

## 2022-03-02 DIAGNOSIS — Z7951 Long term (current) use of inhaled steroids: Secondary | ICD-10-CM | POA: Insufficient documentation

## 2022-03-02 DIAGNOSIS — Z79899 Other long term (current) drug therapy: Secondary | ICD-10-CM | POA: Insufficient documentation

## 2022-03-02 DIAGNOSIS — B9789 Other viral agents as the cause of diseases classified elsewhere: Secondary | ICD-10-CM | POA: Insufficient documentation

## 2022-03-02 DIAGNOSIS — J028 Acute pharyngitis due to other specified organisms: Secondary | ICD-10-CM | POA: Insufficient documentation

## 2022-03-02 DIAGNOSIS — J029 Acute pharyngitis, unspecified: Secondary | ICD-10-CM

## 2022-03-02 DIAGNOSIS — J45909 Unspecified asthma, uncomplicated: Secondary | ICD-10-CM | POA: Diagnosis not present

## 2022-03-02 DIAGNOSIS — Z20822 Contact with and (suspected) exposure to covid-19: Secondary | ICD-10-CM | POA: Insufficient documentation

## 2022-03-02 DIAGNOSIS — R0981 Nasal congestion: Secondary | ICD-10-CM

## 2022-03-02 DIAGNOSIS — Z7901 Long term (current) use of anticoagulants: Secondary | ICD-10-CM | POA: Diagnosis not present

## 2022-03-02 DIAGNOSIS — I10 Essential (primary) hypertension: Secondary | ICD-10-CM | POA: Diagnosis not present

## 2022-03-02 LAB — URINALYSIS, ROUTINE W REFLEX MICROSCOPIC
Bilirubin Urine: NEGATIVE
Glucose, UA: NEGATIVE mg/dL
Hgb urine dipstick: NEGATIVE
Ketones, ur: NEGATIVE mg/dL
Leukocytes,Ua: NEGATIVE
Nitrite: NEGATIVE
Protein, ur: NEGATIVE mg/dL
Specific Gravity, Urine: 1.018 (ref 1.005–1.030)
pH: 5 (ref 5.0–8.0)

## 2022-03-02 LAB — GROUP A STREP BY PCR: Group A Strep by PCR: NOT DETECTED

## 2022-03-02 LAB — SARS CORONAVIRUS 2 BY RT PCR: SARS Coronavirus 2 by RT PCR: NEGATIVE

## 2022-03-02 NOTE — ED Triage Notes (Signed)
Patient here POV from Home.  Endorses Sore Throat present and worsening for 3 Days.  No Known Fevers. Mild Body Aches. No Cough.   NAD Noted during Triage. A&Ox4. GCS 15. Ambulatory.

## 2022-03-02 NOTE — ED Provider Notes (Cosign Needed Addendum)
[facility] Provider Note   CSN: [id number] Arrival date & time: 03/02/22  1742     History  Chief Complaint  Patient presents with   Sore Throat    James Acevedo is a 81 y.o. male.   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     Home Medications Prior to Admission medications   Medication Sig Start Date End Date Taking? Authorizing Provider  albuterol (VENTOLIN HFA) 108 (90 Base) MCG/ACT inhaler Inhale 2 puffs into the lungs every 4 (four) hours as needed for wheezing or shortness of breath.    [provider]  apixaban (ELIQUIS) 5 MG TABS tablet Take 2 tablets (10 mg total) by mouth 2 times daily for 11 doses. 12/27/21 01/07/22  [name], MD  apixaban (ELIQUIS) 5 MG TABS tablet Take 1 tablet (5 mg total) by mouth 2 (two) times daily after finishing loading dose. 01/02/22 02/01/22  [name], MD  bisoprolol-hydrochlorothiazide [facility]) 5-6.25 MG per tablet Take 1 tablet by mouth 2 (two) times daily.    [provider]  Cholecalciferol (VITAMIN D3 PO) Take 1 capsule by mouth daily.    [provider]  fluticasone (FLONASE) 50 MCG/ACT nasal spray Place 1 spray into both nostrils daily as needed for allergies or rhinitis. 04/19/16   [provider]  irbesartan (AVAPRO) 150 MG tablet Take 150 mg by mouth daily.    [provider]  levothyroxine (SYNTHROID) 75 MCG tablet Take 75 mcg by mouth daily before breakfast.    [provider]  loratadine (CLARITIN) 10 MG tablet Take 10 mg by mouth at bedtime.    [provider]  mometasone (ASMANEX) 220 MCG/INH inhaler Inhale 1 puff into the lungs 2 (two) times daily.    [provider]  montelukast (SINGULAIR) 10 MG tablet Take 1 tablet (10 mg total) by mouth at bedtime. 05/01/16   Kozlow, Donnamarie Poag, MD  Multiple Vitamin (MULTIVITAMIN) tablet Take 1 tablet by mouth daily with breakfast.    [provider]  omeprazole (PRILOSEC) 20 MG capsule Take 1 capsule (20 mg total) by mouth 2 (two) times daily. 12/27/21 12/27/22  [name], MD  pravastatin (PRAVACHOL) 20 MG tablet Take 20 mg by mouth every evening.     [provider]  solifenacin (VESICARE) 5 MG tablet Take 5 mg by mouth at bedtime.    [provider]      Allergies    Aspartame and phenylalanine and Lisinopril    Review of Systems   Review of Systems  Physical Exam Updated Vital Signs BP 138/88   Pulse 74   Temp 98 F (36.7 C)   Resp 18   Ht '6\' 2"'$  (1.88 m)   Wt 131.5 kg   SpO2 96%   BMI 37.22 kg/m  Physical Exam Vitals and nursing note reviewed.  Constitutional:      General: He is not in acute distress. HENT:     Head: Normocephalic and atraumatic.     Nose: Nose normal.     Mouth/Throat:     Comments: Clear PND and posterior pharyngeal cavity.  No uvular  deviation, normal phonation Eyes:     General: No scleral icterus. Cardiovascular:     Rate and Rhythm: Normal rate and regular rhythm.     Pulses: Normal pulses.     Heart sounds: Normal heart sounds.  Pulmonary:     Effort: Pulmonary effort is normal. No respiratory distress.     Breath sounds: No wheezing.  Abdominal:     Palpations: Abdomen is soft.     Tenderness: There is no abdominal tenderness. There is no guarding or rebound.  Musculoskeletal:     Cervical back: Normal range of motion.     Right lower leg: No edema.     Left lower leg: No edema.  Skin:    General: Skin is warm and dry.     Capillary Refill: Capillary refill takes less than 2 seconds.  Neurological:      Mental Status: He is alert. Mental status is at baseline.  Psychiatric:        Mood and Affect: Mood normal.        Behavior: Behavior normal.     ED Results / Procedures / Treatments   Labs (all labs ordered are listed, but only abnormal results are displayed) Labs Reviewed  GROUP A STREP BY PCR  SARS CORONAVIRUS 2 BY RT PCR  URINALYSIS, ROUTINE W REFLEX MICROSCOPIC    EKG None  Radiology DG Chest 2 View  Result Date: 03/02/2022 CLINICAL DATA:  Cough and sore throat EXAM: CHEST - 2 VIEW COMPARISON:  12/24/2021 FINDINGS: Cardiac shadow is mildly enlarged. Previously seen right basilar infiltrate has resolved in the interval. No sizable effusion is seen. No bony abnormality noted. Mild scarring is noted in the right mid lung decidual from the prior exam. IMPRESSION: Mild right perihilar scarring. No other focal abnormality is noted. Electronically Signed   By: Inez Catalina M.D.   On: 03/02/2022 20:03    Procedures Procedures    Medications Ordered in ED Medications - No data to display  ED Course/ Medical Decision Making/ A&P                           Medical Decision Making Amount and/or Complexity of Data Reviewed Labs: ordered. Radiology: ordered.   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  Physical exam generally unremarkable.  Patient is overall well-appearing.  He is a pleasant 81 year old gentleman.  Breathing well, vital signs normal.  No hypoxia or tachycardia.  He denies any chest pain or difficulty breathing.  Chest x-ray personally viewed by me after ordering this is without evidence of pneumonia or other acute  abnormality.  Briefly discussed with my attending physician  COVID and strep negative.  Urinalysis unremarkable.  This was obtained because he states that he is urinating somewhat more frequently. Suspect that prostatomegaly may be causing this.  Age and history of enlarged prostate and prostate cancer.  He will follow-up with urology.   Final Clinical Impression(s) / ED Diagnoses Final diagnoses:  Sinus congestion  Viral pharyngitis    Rx / DC Orders ED Discharge Orders     None  Tedd Sias, [place] 03/02/22 2043    Tedd Sias, [place] 03/02/22 2043    Gareth Morgan, MD 03/03/22 [phone]

## 2022-03-02 NOTE — Discharge Instructions (Addendum)
Your strep, COVID test were both negative.  Your chest x-ray was without any evidence of pneumonia.  Make sure you are drinking plenty of water and take Tylenol 1000 mg every 6 hours as needed for pain and body aches as this will help with myalgias/body aches.  Take all of your medications as prescribed.  Follow-up with your primary care provider.  Return to the emergency room for any new or concerning symptoms.  Follow-up with your urologist regarding the frequent urination.  Your urinalysis is without evidence of infection.

## 2022-03-08 ENCOUNTER — Other Ambulatory Visit (HOSPITAL_COMMUNITY): Payer: Self-pay

## 2022-03-08 ENCOUNTER — Telehealth: Payer: Self-pay | Admitting: Oncology

## 2022-03-08 NOTE — Telephone Encounter (Signed)
R/s pt's new hem appt. Pt is aware of new appt date/time.  

## 2022-03-14 ENCOUNTER — Inpatient Hospital Stay: Payer: No Typology Code available for payment source

## 2022-03-14 ENCOUNTER — Inpatient Hospital Stay: Payer: No Typology Code available for payment source | Admitting: Physician Assistant

## 2022-03-15 ENCOUNTER — Inpatient Hospital Stay: Payer: No Typology Code available for payment source | Attending: Physician Assistant | Admitting: Oncology

## 2022-03-15 VITALS — BP 118/71 | HR 98 | Temp 97.9°F | Resp 18 | Ht 74.0 in | Wt 284.5 lb

## 2022-03-15 DIAGNOSIS — Z7901 Long term (current) use of anticoagulants: Secondary | ICD-10-CM | POA: Insufficient documentation

## 2022-03-15 DIAGNOSIS — Z79899 Other long term (current) drug therapy: Secondary | ICD-10-CM | POA: Insufficient documentation

## 2022-03-15 DIAGNOSIS — Z7951 Long term (current) use of inhaled steroids: Secondary | ICD-10-CM | POA: Diagnosis not present

## 2022-03-15 DIAGNOSIS — Z86711 Personal history of pulmonary embolism: Secondary | ICD-10-CM | POA: Insufficient documentation

## 2022-03-15 DIAGNOSIS — Z8546 Personal history of malignant neoplasm of prostate: Secondary | ICD-10-CM | POA: Diagnosis not present

## 2022-03-15 DIAGNOSIS — I10 Essential (primary) hypertension: Secondary | ICD-10-CM | POA: Diagnosis not present

## 2022-03-15 DIAGNOSIS — I2699 Other pulmonary embolism without acute cor pulmonale: Secondary | ICD-10-CM | POA: Diagnosis not present

## 2022-03-15 NOTE — Progress Notes (Signed)
Reason for the request:    Pulmonary embolism HPI: I was asked by Dr. Ellouise Newer  to evaluate James Acevedo for pulmonary embolism.  He is a 81 year old man with a history of hypertension, prostate cancer who presented with symptoms of shortness of breath and right-sided chest pain in July 2023.  His evaluation at that time included a CT scan of the chest which showed a acute right greater than left pulmonary emboli without heart strain.  He was treated with intravenous heparin and subsequently discharged on Eliquis.  He reported recent travel been on a cruise 3 weeks prior to this episode and also a long car ride.  Clinically, he reports no major complaints at this time.  He denies any complications related to Eliquis.  He denies hematochezia or melena or hemoptysis.  He denies any previous venous thrombosis but had a history of TIA.  He has no family history of venous thromboembolism.   He does not report any headaches, blurry vision, syncope or seizures. Does not report any fevers, chills or sweats.  Does not report any cough, wheezing or hemoptysis.  Does not report any chest pain, palpitation, orthopnea or leg edema.  Does not report any nausea, vomiting or abdominal pain.  Does not report any constipation or diarrhea.  Does not report any skeletal complaints.    Does not report frequency, urgency or hematuria.  Does not report any skin rashes or lesions. Does not report any heat or cold intolerance.  Does not report any lymphadenopathy or petechiae.  Does not report any anxiety or depression.  Remaining review of systems is negative.     Past Medical History:  Diagnosis Date   Agent orange exposure    Asthma    Cancer ([place])    Depression    GERD (gastroesophageal reflux disease)    History of TIA (transient ischemic attack) OCTOBER 2012--  NO RESIDUAL   NEURO CONSULT IN FEB 2013   Hypertension    Prostate cancer ([place]) 04/12/10   gleason 3+3=6, volume 40 cc   Sinus abscess   :   Past  Surgical History:  Procedure Laterality Date   CYSTOSCOPY  11/26/2011   Procedure: CYSTOSCOPY;  Surgeon: James Amass, MD;  Location: [facility];  Service: Urology;  Laterality: N/A;  NO SEEDS FOUND IN BLADDER   EXICISON LIPOMA     BACK  IN 2006  AND HEAD  IN 2009   LEFT WRIST RECONSTRUCTION  2000   PROSTATE BIOPSY  08/02/11   gleason 3+3=6, volume 35 cc   RADIOACTIVE SEED IMPLANT  11/26/2011   Procedure: RADIOACTIVE SEED IMPLANT;  Surgeon: James Amass, MD;  Location: [facility];  Service: Urology;  Laterality: N/A;   70 SEEDS IMPLANTED   TONSILLECTOMY  AGE 29  :   Current Outpatient Medications:    albuterol (VENTOLIN HFA) 108 (90 Base) MCG/ACT inhaler, Inhale 2 puffs into the lungs every 4 (four) hours as needed for wheezing or shortness of breath., Disp: , Rfl:    apixaban (ELIQUIS) 5 MG TABS tablet, Take 2 tablets (10 mg total) by mouth 2 times daily for 11 doses., Disp: 22 tablet, Rfl: 0   apixaban (ELIQUIS) 5 MG TABS tablet, Take 1 tablet (5 mg total) by mouth 2 (two) times daily after finishing loading dose., Disp: 60 tablet, Rfl: 0   bisoprolol-hydrochlorothiazide (ZIAC) 5-6.25 MG per tablet, Take 1 tablet by mouth 2 (two) times daily., Disp: , Rfl:    Cholecalciferol (VITAMIN  D3 PO), Take 1 capsule by mouth daily., Disp: , Rfl:    fluticasone (FLONASE) 50 MCG/ACT nasal spray, Place 1 spray into both nostrils daily as needed for allergies or rhinitis., Disp: , Rfl:    irbesartan (AVAPRO) 150 MG tablet, Take 150 mg by mouth daily., Disp: , Rfl:    levothyroxine (SYNTHROID) 75 MCG tablet, Take 75 mcg by mouth daily before breakfast., Disp: , Rfl:    loratadine (CLARITIN) 10 MG tablet, Take 10 mg by mouth at bedtime., Disp: , Rfl:    mometasone (ASMANEX) 220 MCG/INH inhaler, Inhale 1 puff into the lungs 2 (two) times daily., Disp: , Rfl:    montelukast (SINGULAIR) 10 MG tablet, Take 1 tablet (10 mg total) by mouth at bedtime., Disp: 30 tablet, Rfl: 5    Multiple Vitamin (MULTIVITAMIN) tablet, Take 1 tablet by mouth daily with breakfast., Disp: , Rfl:    omeprazole (PRILOSEC) 20 MG capsule, Take 1 capsule (20 mg total) by mouth 2 (two) times daily., Disp: 28 capsule, Rfl: 0   pravastatin (PRAVACHOL) 20 MG tablet, Take 20 mg by mouth every evening. , Disp: , Rfl:    solifenacin (VESICARE) 5 MG tablet, Take 5 mg by mouth at bedtime., Disp: , Rfl: :   Allergies  Allergen Reactions   Aspartame And Phenylalanine Other (See Comments)    "Makes my head feel odd"   Lisinopril Cough  :   Family History  Problem Relation Age of Onset   Cancer Mother        lung   COPD Father    Cancer Maternal Aunt        unknown//blood?   Cancer Maternal Uncle        unknown  :   Social History   Socioeconomic History   Marital status: Widowed    Spouse name: Not on file   Number of children: Not on file   Years of education: Not on file   Highest education level: Not on file  Occupational History   Not on file  Tobacco Use   Smoking status: Former    Packs/day: 1.00    Years: 10.00    Total pack years: 10.00    Types: Cigarettes    Quit date: 06/18/1978    Years since quitting: 43.7   Smokeless tobacco: Never  Vaping Use   Vaping Use: Never used  Substance and Sexual Activity   Alcohol use: No    Comment: drinks socially   Drug use: No   Sexual activity: Not on file  Other Topics Concern   Not on file  Social History Narrative   Not on file   Social Determinants of Health   Financial Resource Strain: Not on file  Food Insecurity: Not on file  Transportation Needs: Not on file  Physical Activity: Not on file  Stress: Not on file  Social Connections: Not on file  Intimate Partner Violence: Not on file  :  Pertinent items are noted in HPI.  Exam: Blood pressure 118/71, pulse 98, temperature 97.9 F (36.6 C), temperature source Tympanic, resp. rate 18, height '6\' 2"'$  (1.88 m), weight 284 lb 8 oz (129 kg), SpO2 97  %.   General appearance: alert and cooperative appeared without distress. Head: atraumatic without any abnormalities. Eyes: conjunctivae/corneas clear. PERRL.  Sclera anicteric. Throat: lips, mucosa, and tongue normal; without oral thrush or ulcers. Resp: clear to auscultation bilaterally without rhonchi, wheezes or dullness to percussion. Cardio: regular rate and rhythm, S1, S2 normal, no  murmur, click, rub or gallop GI: soft, non-tender; bowel sounds normal; no masses,  no organomegaly Skin: Skin color, texture, turgor normal. No rashes or lesions Lymph nodes: Cervical, supraclavicular, and axillary nodes normal. Neurologic: Grossly normal without any motor, sensory or deep tendon reflexes. Musculoskeletal: No joint deformity or effusion.    Assessment and Plan:    81 year old with:  1.  Pulmonary embolism diagnosed in July 2023.  He presented with bilateral pulmonary emboli without heart strain and findings consistent with pulmonary infarct.  He did have travel 3 weeks leading up to that episode but no other provoking symptoms.  The natural course of this disease was reviewed at this time and treatment options were discussed.  He is currently on Eliquis and the duration of therapy was discussed.  Given the serious nature of his pulmonary embolism and infarct long-term anticoagulation he is recommended.  Despite his recent travel and cruise trip, it is unclear if this pulmonary embolism is clearly provoked.   After discussion today, I have recommended full dose anticoagulation with Eliquis for at least 6 months.  Dropping to half dose could be considered after that if bleeding complications arise.  I recommended anticoagulation lifetime at this time.  Obtaining a hypercoagulable profile was discussed and considered.  At this time, I do not think [employer].  The likelihood of inherited or acquired thrombophilia is low.  2.  Follow-up: He will return for follow-up as  needed.  45  minutes were dedicated to this visit. The time was spent on reviewing laboratory data, imaging studies, discussing treatment options, discussing differential diagnosis and answering questions regarding future plan.    A copy of this consult has been forwarded to the requesting physician.

## 2022-07-12 DIAGNOSIS — Z8546 Personal history of malignant neoplasm of prostate: Secondary | ICD-10-CM | POA: Diagnosis not present

## 2022-07-12 DIAGNOSIS — E291 Testicular hypofunction: Secondary | ICD-10-CM | POA: Diagnosis not present

## 2022-07-26 ENCOUNTER — Emergency Department (HOSPITAL_BASED_OUTPATIENT_CLINIC_OR_DEPARTMENT_OTHER): Payer: No Typology Code available for payment source

## 2022-07-26 ENCOUNTER — Encounter (HOSPITAL_BASED_OUTPATIENT_CLINIC_OR_DEPARTMENT_OTHER): Payer: Self-pay | Admitting: Radiology

## 2022-07-26 ENCOUNTER — Other Ambulatory Visit: Payer: Self-pay

## 2022-07-26 ENCOUNTER — Emergency Department (HOSPITAL_BASED_OUTPATIENT_CLINIC_OR_DEPARTMENT_OTHER)
Admission: EM | Admit: 2022-07-26 | Discharge: 2022-07-26 | Disposition: A | Discharge status: Home / Self Care | Payer: No Typology Code available for payment source | Attending: Emergency Medicine | Admitting: Emergency Medicine

## 2022-07-26 DIAGNOSIS — Z7901 Long term (current) use of anticoagulants: Secondary | ICD-10-CM | POA: Insufficient documentation

## 2022-07-26 DIAGNOSIS — Z79899 Other long term (current) drug therapy: Secondary | ICD-10-CM | POA: Diagnosis not present

## 2022-07-26 DIAGNOSIS — J45909 Unspecified asthma, uncomplicated: Secondary | ICD-10-CM | POA: Diagnosis not present

## 2022-07-26 DIAGNOSIS — Z8673 Personal history of transient ischemic attack (TIA), and cerebral infarction without residual deficits: Secondary | ICD-10-CM | POA: Diagnosis not present

## 2022-07-26 DIAGNOSIS — Z7951 Long term (current) use of inhaled steroids: Secondary | ICD-10-CM | POA: Insufficient documentation

## 2022-07-26 DIAGNOSIS — I10 Essential (primary) hypertension: Secondary | ICD-10-CM | POA: Diagnosis not present

## 2022-07-26 DIAGNOSIS — R0602 Shortness of breath: Secondary | ICD-10-CM | POA: Insufficient documentation

## 2022-07-26 DIAGNOSIS — R071 Chest pain on breathing: Secondary | ICD-10-CM | POA: Diagnosis present

## 2022-07-26 DIAGNOSIS — Z859 Personal history of malignant neoplasm, unspecified: Secondary | ICD-10-CM | POA: Insufficient documentation

## 2022-07-26 LAB — CBC WITH DIFFERENTIAL/PLATELET
Abs Immature Granulocytes: 0.01 10*3/uL (ref 0.00–0.07)
Basophils Absolute: 0 10*3/uL (ref 0.0–0.1)
Basophils Relative: 1 %
Eosinophils Absolute: 0.1 10*3/uL (ref 0.0–0.5)
Eosinophils Relative: 1 %
HCT: 42.4 % (ref 39.0–52.0)
Hemoglobin: 14.1 g/dL (ref 13.0–17.0)
Immature Granulocytes: 0 %
Lymphocytes Relative: 22 %
Lymphs Abs: 1.5 10*3/uL (ref 0.7–4.0)
MCH: 28.7 pg (ref 26.0–34.0)
MCHC: 33.3 g/dL (ref 30.0–36.0)
MCV: 86.2 fL (ref 80.0–100.0)
Monocytes Absolute: 0.6 10*3/uL (ref 0.1–1.0)
Monocytes Relative: 9 %
Neutro Abs: 4.8 10*3/uL (ref 1.7–7.7)
Neutrophils Relative %: 67 %
Platelets: 171 10*3/uL (ref 150–400)
RBC: 4.92 MIL/uL (ref 4.22–5.81)
RDW: 14.1 % (ref 11.5–15.5)
WBC: 7 10*3/uL (ref 4.0–10.5)
nRBC: 0 % (ref 0.0–0.2)

## 2022-07-26 LAB — BASIC METABOLIC PANEL
Anion gap: 9 (ref 5–15)
BUN: 17 mg/dL (ref 8–23)
CO2: 28 mmol/L (ref 22–32)
Calcium: 9.3 mg/dL (ref 8.9–10.3)
Chloride: 103 mmol/L (ref 98–111)
Creatinine, Ser: 1.15 mg/dL (ref 0.61–1.24)
GFR, Estimated: >60 mL/min (ref >60)
Glucose, Bld: 99 mg/dL (ref 70–99)
Potassium: 3.7 mmol/L (ref 3.5–5.1)
Sodium: 140 mmol/L (ref 135–145)

## 2022-07-26 LAB — TROPONIN I (HIGH SENSITIVITY): Troponin I (High Sensitivity): 5 ng/L (ref <18)

## 2022-07-26 MED ORDER — IOHEXOL 350 MG/ML SOLN
100.0000 mL | Freq: Once | INTRAVENOUS | Status: AC | PRN
  Administered 2022-07-26: 75 mL via INTRAVENOUS

## 2022-07-26 NOTE — ED Triage Notes (Signed)
Pt arrived via POV, chest pain on L side of chest and history of blood clot in lungs. Pain 2/10, described as soreness. Some dizziness but has a mass at back of head and could be cause of dizziness. A/ox4, VSS.

## 2022-07-26 NOTE — Discharge Instructions (Addendum)
Recommend you follow-up with your primary care doctor and cardiologist.  Please return if your symptoms are persistent.  Stated below is your CT scan that shows your dilation of your thoracic aorta.  You will need may be an annual scan to follow this along.  Show this to your primary care doctor.  1. No evidence of pulmonary embolism or other acute intrathoracic process. 2. Mildly dilated central pulmonary arteries, which can be seen in the setting of pulmonary arterial hypertension. 3. Cardiomegaly. 4. 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 Recommend annual imaging followup by CTA or MRA. This recommendation follows 2010 ACCF/AHA/AATS/ACR/ASA/SCA/SCAI/SIR/STS/SVM Guidelines for the Diagnosis and Management of Patients with Thoracic Aortic Disease. Circulation.2010; 121: E174-J159. Aortic aneurysm NOS (ICD10-I71.9) 5. Small hiatal hernia. 6. Aortic and coronary artery atherosclerosis (ICD10-I70.0).

## 2022-07-26 NOTE — ED Provider Notes (Signed)
[facility] Provider Note   CSN: [id number] Arrival date & time: 07/26/22  1252     History  Chief Complaint  Patient presents with   Chest Pain    James Acevedo is a 82 y.o. male.  Patient here with left-sided chest pain with deep breaths for the last week.  History of blood clots on Eliquis.  History of depression, asthma, TIA, hypertension, acid reflux, cancer.  He denies any exertional chest pain, weakness, numbness, loss of vision.  No history of CAD.  Denies any active symptoms.  Mostly he states that he has some discomfort when he takes a deep breath then which feels similar to when he had a blood clot in the past.  He was sent by urgent care for further evaluation.  The history is provided by the patient.       Home Medications Prior to Admission medications   Medication Sig Start Date End Date Taking? Authorizing Provider  albuterol (VENTOLIN HFA) 108 (90 Base) MCG/ACT inhaler Inhale 2 puffs into the lungs every 4 (four) hours as needed for wheezing or shortness of breath.    [provider]  apixaban (ELIQUIS) 5 MG TABS tablet Take 2 tablets (10 mg total) by mouth 2 times daily for 11 doses. 12/27/21 01/07/22  [name], MD  apixaban (ELIQUIS) 5 MG TABS tablet Take 1 tablet (5 mg total) by mouth 2 (two) times daily after finishing loading dose. 01/02/22 02/01/22  [name], MD  bisoprolol-hydrochlorothiazide [facility]) 5-6.25 MG per tablet Take 1 tablet by mouth 2 (two) times daily.    [provider]  buPROPion (WELLBUTRIN XL) 150 MG 24 hr tablet Take 150 mg by mouth 2 (two) times daily. 05/22/22   [provider]  Cholecalciferol (VITAMIN D3 PO) Take 1 capsule by mouth daily.    [provider]  fluticasone (FLONASE) 50 MCG/ACT nasal spray Place 1 spray into both nostrils daily as needed for allergies or rhinitis. 04/19/16   [provider]  irbesartan (AVAPRO)  150 MG tablet Take 150 mg by mouth daily.    [provider]  levocetirizine (XYZAL) 5 MG tablet Take 5 mg by mouth every evening. 05/30/22   [provider]  levothyroxine (SYNTHROID) 75 MCG tablet Take 75 mcg by mouth daily before breakfast.    [provider]  loratadine (CLARITIN) 10 MG tablet Take 10 mg by mouth at bedtime.    [provider]  mometasone (ASMANEX) 220 MCG/INH inhaler Inhale 1 puff into the lungs 2 (two) times daily.    [provider]  montelukast (SINGULAIR) 10 MG tablet Take 1 tablet (10 mg total) by mouth at bedtime. 05/01/16   Kozlow, Donnamarie Poag, MD  Multiple Vitamin (MULTIVITAMIN) tablet Take 1 tablet by mouth daily with breakfast.    [provider]  omeprazole (PRILOSEC) 20 MG capsule Take 1 capsule (20 mg total) by mouth 2 (two) times daily. 12/27/21 12/27/22  [name], MD  oxybutynin (DITROPAN) 5 MG tablet Take 5 mg by mouth 2 (two) times daily. 05/14/22   [provider]  pravastatin (PRAVACHOL) 20 MG tablet Take 20 mg by mouth every evening.     [provider]  solifenacin (VESICARE) 5 MG tablet Take 5 mg by mouth at bedtime.    [provider]      Allergies    Aspartame and phenylalanine and Lisinopril    Review of Systems   Review of Systems  Physical Exam Updated Vital Signs BP 136/67 (BP Location: Right Arm)   Pulse (!) 59   Temp 97.6 F (36.4 C) (Oral)   Resp 20   Ht '6\' 2"'$  (1.88 m)   Wt 122 kg   SpO2 96%   BMI 34.54 kg/m  Physical Exam Vitals and nursing note reviewed.  Constitutional:      General: He is not in acute distress.    Appearance: He is well-developed. He is not ill-appearing.  HENT:     Head: Normocephalic and atraumatic.  Eyes:     Extraocular Movements: Extraocular movements intact.     Conjunctiva/sclera: Conjunctivae normal.     Pupils: Pupils are equal, round, and reactive to light.  Cardiovascular:     Rate and Rhythm: Normal  rate and regular rhythm.     Pulses:          Radial pulses are 2+ on the right side and 2+ on the left side.     Heart sounds: Normal heart sounds. No murmur heard. Pulmonary:     Effort: Pulmonary effort is normal. No respiratory distress.     Breath sounds: Normal breath sounds. No decreased breath sounds, wheezing or rhonchi.  Abdominal:     Palpations: Abdomen is soft.     Tenderness: There is no abdominal tenderness.  Musculoskeletal:        General: No swelling. Normal range of motion.     Cervical back: Normal range of motion and neck supple.     Right lower leg: No edema.     Left lower leg: No edema.  Skin:    General: Skin is warm and dry.     Capillary Refill: Capillary refill takes less than 2 seconds.  Neurological:     General: No focal deficit present.     Mental Status: He is alert.  Psychiatric:        Mood and Affect: Mood normal.     ED Results / Procedures / Treatments   Labs (all labs ordered are listed, but only abnormal results are displayed) Labs Reviewed  CBC WITH DIFFERENTIAL/PLATELET  BASIC METABOLIC PANEL  TROPONIN I (HIGH SENSITIVITY)    EKG EKG Interpretation  Date/Time:  Thursday July 26 2022 13:07:06 EST Ventricular Rate:  56 PR Interval:  235 QRS Duration: 185 QT Interval:  472 QTC Calculation: 456 R Axis:   29 Text Interpretation: Sinus rhythm Prolonged PR interval Left bundle branch block Confirmed by Lennice Sites ([id number]) on 07/26/2022 1:10:58 PM  Radiology CT Angio Chest PE W and/or Wo Contrast  Result Date: 07/26/2022 CLINICAL DATA:  Pulmonary embolism (PE) suspected, high prob EXAM: CT ANGIOGRAPHY CHEST WITH CONTRAST TECHNIQUE: Multidetector CT imaging of the chest was performed using the standard protocol during bolus administration of intravenous contrast. Multiplanar CT image reconstructions and MIPs were obtained to evaluate the vascular anatomy. RADIATION DOSE REDUCTION: This exam was performed according to the  departmental dose-optimization program which includes automated exposure control, adjustment of the mA and/or kV according to patient size and/or use of iterative reconstruction technique. CONTRAST:  29m OMNIPAQUE IOHEXOL 350 MG/ML SOLN COMPARISON:  12/25/2021 FINDINGS: Cardiovascular: Satisfactory opacification of the pulmonary arteries to the segmental level. No evidence of pulmonary embolism. Central pulmonary arteries are mildly dilated. Ectatic thoracic aorta. Aortic arch measures up to 3.3 cm in diameter. Scattered atherosclerotic vascular calcifications of the aorta and coronary arteries. Cardiomegaly. No pericardial effusion. Mediastinum/Nodes: No enlarged mediastinal, hilar, or axillary lymph nodes. Thyroid gland, trachea, and esophagus demonstrate  no significant findings. Small hiatal hernia. Lungs/Pleura: Mild bibasilar subsegmental atelectasis. Lungs are otherwise clear. No pleural effusion or pneumothorax. Upper Abdomen: No acute abnormality. Musculoskeletal: No chest wall abnormality. No acute or significant osseous findings. Review of the MIP images confirms the above findings. IMPRESSION: 1. No evidence of pulmonary embolism or other acute intrathoracic process. 2. Mildly dilated central pulmonary arteries, which can be seen in the setting of pulmonary arterial hypertension. 3. Cardiomegaly. 4. 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 Recommend annual imaging followup by CTA or MRA. This recommendation follows 2010 ACCF/AHA/AATS/ACR/ASA/SCA/SCAI/SIR/STS/SVM Guidelines for the Diagnosis and Management of Patients with Thoracic Aortic Disease. Circulation.2010; 121: F818-E993. Aortic aneurysm NOS (ICD10-I71.9) 5. Small hiatal hernia. 6. Aortic and coronary artery atherosclerosis (ICD10-I70.0). Electronically Signed   By: Davina Poke D.O.   On: 07/26/2022 14:29   DG Chest Portable 1 View  Result Date: 07/26/2022 CLINICAL DATA:  Chest pain EXAM: PORTABLE CHEST 1 VIEW COMPARISON:   03/02/2022 FINDINGS: Cardiac silhouette appears prominent. No pneumonia or pulmonary edema. No pneumothorax or pleural effusion. Aorta is calcified. IMPRESSION: Enlarged cardiac silhouette. Electronically Signed   By: Sammie Bench M.D.   On: 07/26/2022 13:38    Procedures Procedures    Medications Ordered in ED Medications  iohexol (OMNIPAQUE) 350 MG/ML injection 100 mL (75 mLs Intravenous Contrast Given 07/26/22 1405)    ED Course/ Medical Decision Making/ A&P                             Medical Decision Making Amount and/or Complexity of Data Reviewed Labs: ordered. Radiology: ordered.  Risk Prescription drug management.   [facility] is here with chest pain and shortness of breath.  Symptoms only when he takes deep breath.  Feels like when he had a blood clot.  He has been on Eliquis for the last few months for blood clot.  Denies any active chest pain or shortness of breath.  Denies any exertional symptoms.  He has a very mild pain in the chest when he takes a deep breath and feels little bit short of breath at times when he does that but that has been ongoing for the past week.  Pretty constant.  He has had some chills but denies any cough or fever or sputum production.  His EKG today shows bundle branch with T wave inversions throughout which seem similar to prior.  Per chart review he had echocardiogram and ischemic workup done couple years back that was unremarkable including no cardiac stress test.  He has no history of CAD.  He has a history of blood clot he is on Eliquis.  He is been compliant.  Prostate cancer, depression, acid reflux.  Overall, patient appears well.  Clear breath sounds.  No signs of volume overload on exam.  Patient to have CBC, BMP, troponin, CT chest done to further evaluate.  Per my review and interpretation labs he has no significant anemia or electrolyte abnormality or kidney injury.  Troponin is normal.  CT scan of the chest showed no evidence of  blood clot.  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  Was made aware of this and need to have annual scans and follow-up.  Discomfort seems atypical.  I do not think this is ACS.  EKG and troponin are unremarkable.  Pains been fairly constant for couple days.  He does not have any exertional symptoms.  He had an ischemic workup  done fairly recently but do encourage that he follow-up with his cardiologist and return to the ED if he is having persistent symptoms/exertional symptoms.  Otherwise I do not see any evidence of pneumonia on imaging.  Patient discharged in good condition.  Understands return precautions.  This chart was dictated using voice recognition software.  Despite best efforts to proofread,  errors can occur which can change the documentation meaning.         Final Clinical Impression(s) / ED Diagnoses Final diagnoses:  SOB (shortness of breath)    Rx / DC Orders ED Discharge Orders     None         Lennice Sites, DO 07/26/22 1458

## 2022-10-10 DIAGNOSIS — I10 Essential (primary) hypertension: Secondary | ICD-10-CM | POA: Diagnosis not present

## 2022-10-10 DIAGNOSIS — G4730 Sleep apnea, unspecified: Secondary | ICD-10-CM | POA: Diagnosis not present

## 2022-10-10 DIAGNOSIS — Z91018 Allergy to other foods: Secondary | ICD-10-CM | POA: Diagnosis not present

## 2022-10-10 DIAGNOSIS — Z8546 Personal history of malignant neoplasm of prostate: Secondary | ICD-10-CM | POA: Diagnosis not present

## 2022-10-10 DIAGNOSIS — K2950 Unspecified chronic gastritis without bleeding: Secondary | ICD-10-CM | POA: Diagnosis not present

## 2022-10-10 DIAGNOSIS — R070 Pain in throat: Secondary | ICD-10-CM | POA: Diagnosis not present

## 2022-10-10 DIAGNOSIS — K317 Polyp of stomach and duodenum: Secondary | ICD-10-CM | POA: Diagnosis not present

## 2022-10-10 DIAGNOSIS — Z87891 Personal history of nicotine dependence: Secondary | ICD-10-CM | POA: Diagnosis not present

## 2022-10-10 DIAGNOSIS — G4733 Obstructive sleep apnea (adult) (pediatric): Secondary | ICD-10-CM | POA: Diagnosis not present

## 2022-10-10 DIAGNOSIS — Z888 Allergy status to other drugs, medicaments and biological substances status: Secondary | ICD-10-CM | POA: Diagnosis not present

## 2022-10-10 DIAGNOSIS — Z86711 Personal history of pulmonary embolism: Secondary | ICD-10-CM | POA: Diagnosis not present

## 2022-10-10 DIAGNOSIS — K219 Gastro-esophageal reflux disease without esophagitis: Secondary | ICD-10-CM | POA: Diagnosis not present

## 2022-10-10 DIAGNOSIS — R12 Heartburn: Secondary | ICD-10-CM | POA: Diagnosis not present

## 2022-10-10 DIAGNOSIS — E039 Hypothyroidism, unspecified: Secondary | ICD-10-CM | POA: Diagnosis not present

## 2022-10-10 DIAGNOSIS — Z79899 Other long term (current) drug therapy: Secondary | ICD-10-CM | POA: Diagnosis not present

## 2022-10-11 DIAGNOSIS — K317 Polyp of stomach and duodenum: Secondary | ICD-10-CM | POA: Diagnosis not present

## 2022-10-11 DIAGNOSIS — K2950 Unspecified chronic gastritis without bleeding: Secondary | ICD-10-CM | POA: Diagnosis not present

## 2022-10-11 DIAGNOSIS — K259 Gastric ulcer, unspecified as acute or chronic, without hemorrhage or perforation: Secondary | ICD-10-CM | POA: Diagnosis not present

## 2022-10-23 ENCOUNTER — Other Ambulatory Visit: Payer: Self-pay

## 2022-11-20 DIAGNOSIS — E7800 Pure hypercholesterolemia, unspecified: Secondary | ICD-10-CM | POA: Diagnosis not present

## 2022-11-20 DIAGNOSIS — R7303 Prediabetes: Secondary | ICD-10-CM | POA: Diagnosis not present

## 2022-11-20 DIAGNOSIS — Z125 Encounter for screening for malignant neoplasm of prostate: Secondary | ICD-10-CM | POA: Diagnosis not present

## 2022-11-20 DIAGNOSIS — R739 Hyperglycemia, unspecified: Secondary | ICD-10-CM | POA: Diagnosis not present

## 2022-11-27 DIAGNOSIS — Z86711 Personal history of pulmonary embolism: Secondary | ICD-10-CM | POA: Diagnosis not present

## 2022-11-27 DIAGNOSIS — E7800 Pure hypercholesterolemia, unspecified: Secondary | ICD-10-CM | POA: Diagnosis not present

## 2022-11-27 DIAGNOSIS — Z7901 Long term (current) use of anticoagulants: Secondary | ICD-10-CM | POA: Diagnosis not present

## 2022-11-27 DIAGNOSIS — E039 Hypothyroidism, unspecified: Secondary | ICD-10-CM | POA: Diagnosis not present

## 2022-11-27 DIAGNOSIS — I10 Essential (primary) hypertension: Secondary | ICD-10-CM | POA: Diagnosis not present

## 2022-11-27 DIAGNOSIS — R7303 Prediabetes: Secondary | ICD-10-CM | POA: Diagnosis not present

## 2022-12-25 DIAGNOSIS — Z8546 Personal history of malignant neoplasm of prostate: Secondary | ICD-10-CM | POA: Diagnosis not present

## 2023-02-12 DIAGNOSIS — N5201 Erectile dysfunction due to arterial insufficiency: Secondary | ICD-10-CM | POA: Diagnosis not present

## 2023-05-02 DIAGNOSIS — E785 Hyperlipidemia, unspecified: Secondary | ICD-10-CM | POA: Diagnosis not present

## 2023-05-02 DIAGNOSIS — K449 Diaphragmatic hernia without obstruction or gangrene: Secondary | ICD-10-CM | POA: Diagnosis not present

## 2023-05-02 DIAGNOSIS — Z87891 Personal history of nicotine dependence: Secondary | ICD-10-CM | POA: Diagnosis not present

## 2023-05-02 DIAGNOSIS — E039 Hypothyroidism, unspecified: Secondary | ICD-10-CM | POA: Diagnosis not present

## 2023-05-02 DIAGNOSIS — E669 Obesity, unspecified: Secondary | ICD-10-CM | POA: Diagnosis not present

## 2023-05-02 DIAGNOSIS — K219 Gastro-esophageal reflux disease without esophagitis: Secondary | ICD-10-CM | POA: Diagnosis not present

## 2023-05-02 DIAGNOSIS — Z7901 Long term (current) use of anticoagulants: Secondary | ICD-10-CM | POA: Diagnosis not present

## 2023-05-02 DIAGNOSIS — Z888 Allergy status to other drugs, medicaments and biological substances status: Secondary | ICD-10-CM | POA: Diagnosis not present

## 2023-05-02 DIAGNOSIS — J45909 Unspecified asthma, uncomplicated: Secondary | ICD-10-CM | POA: Diagnosis not present

## 2023-05-02 DIAGNOSIS — I10 Essential (primary) hypertension: Secondary | ICD-10-CM | POA: Diagnosis not present

## 2023-05-02 DIAGNOSIS — Z79890 Hormone replacement therapy (postmenopausal): Secondary | ICD-10-CM | POA: Diagnosis not present

## 2023-05-02 DIAGNOSIS — K3189 Other diseases of stomach and duodenum: Secondary | ICD-10-CM | POA: Diagnosis not present

## 2023-05-02 DIAGNOSIS — Z13818 Encounter for screening for other digestive system disorders: Secondary | ICD-10-CM | POA: Diagnosis not present

## 2023-05-02 DIAGNOSIS — Z8546 Personal history of malignant neoplasm of prostate: Secondary | ICD-10-CM | POA: Diagnosis not present

## 2023-05-02 DIAGNOSIS — Z86718 Personal history of other venous thrombosis and embolism: Secondary | ICD-10-CM | POA: Diagnosis not present

## 2023-05-02 DIAGNOSIS — Z86711 Personal history of pulmonary embolism: Secondary | ICD-10-CM | POA: Diagnosis not present

## 2023-05-02 DIAGNOSIS — Z79899 Other long term (current) drug therapy: Secondary | ICD-10-CM | POA: Diagnosis not present

## 2023-05-02 DIAGNOSIS — G4733 Obstructive sleep apnea (adult) (pediatric): Secondary | ICD-10-CM | POA: Diagnosis not present

## 2023-05-03 DIAGNOSIS — K319 Disease of stomach and duodenum, unspecified: Secondary | ICD-10-CM | POA: Diagnosis not present

## 2023-08-19 DIAGNOSIS — I10 Essential (primary) hypertension: Secondary | ICD-10-CM | POA: Diagnosis not present

## 2023-08-19 DIAGNOSIS — R7303 Prediabetes: Secondary | ICD-10-CM | POA: Diagnosis not present

## 2023-08-19 DIAGNOSIS — E559 Vitamin D deficiency, unspecified: Secondary | ICD-10-CM | POA: Diagnosis not present

## 2023-08-19 DIAGNOSIS — E039 Hypothyroidism, unspecified: Secondary | ICD-10-CM | POA: Diagnosis not present

## 2023-08-19 DIAGNOSIS — E7800 Pure hypercholesterolemia, unspecified: Secondary | ICD-10-CM | POA: Diagnosis not present

## 2023-08-26 DIAGNOSIS — E039 Hypothyroidism, unspecified: Secondary | ICD-10-CM | POA: Diagnosis not present

## 2023-08-26 DIAGNOSIS — Z86711 Personal history of pulmonary embolism: Secondary | ICD-10-CM | POA: Diagnosis not present

## 2023-08-26 DIAGNOSIS — Z7901 Long term (current) use of anticoagulants: Secondary | ICD-10-CM | POA: Diagnosis not present

## 2023-08-26 DIAGNOSIS — E559 Vitamin D deficiency, unspecified: Secondary | ICD-10-CM | POA: Diagnosis not present

## 2023-08-26 DIAGNOSIS — I10 Essential (primary) hypertension: Secondary | ICD-10-CM | POA: Diagnosis not present

## 2023-08-26 DIAGNOSIS — E785 Hyperlipidemia, unspecified: Secondary | ICD-10-CM | POA: Diagnosis not present

## 2023-08-26 DIAGNOSIS — Z0000 Encounter for general adult medical examination without abnormal findings: Secondary | ICD-10-CM | POA: Diagnosis not present

## 2023-08-26 DIAGNOSIS — R7303 Prediabetes: Secondary | ICD-10-CM | POA: Diagnosis not present

## 2023-09-03 DIAGNOSIS — Z8546 Personal history of malignant neoplasm of prostate: Secondary | ICD-10-CM | POA: Diagnosis not present

## 2023-09-10 DIAGNOSIS — Z8546 Personal history of malignant neoplasm of prostate: Secondary | ICD-10-CM | POA: Diagnosis not present

## 2023-09-10 DIAGNOSIS — N3941 Urge incontinence: Secondary | ICD-10-CM | POA: Diagnosis not present

## 2023-09-10 DIAGNOSIS — N5201 Erectile dysfunction due to arterial insufficiency: Secondary | ICD-10-CM | POA: Diagnosis not present

## 2023-09-30 IMAGING — DX DG CHEST 2V
2 series · 2 of 2 positions shown · non-contrast
Comparison: 10/24/2011 chest radiograph.

CLINICAL DATA: Cough for 1 month, abnormal lung sounds

EXAM:
CHEST - 2 VIEW

[chest pa]
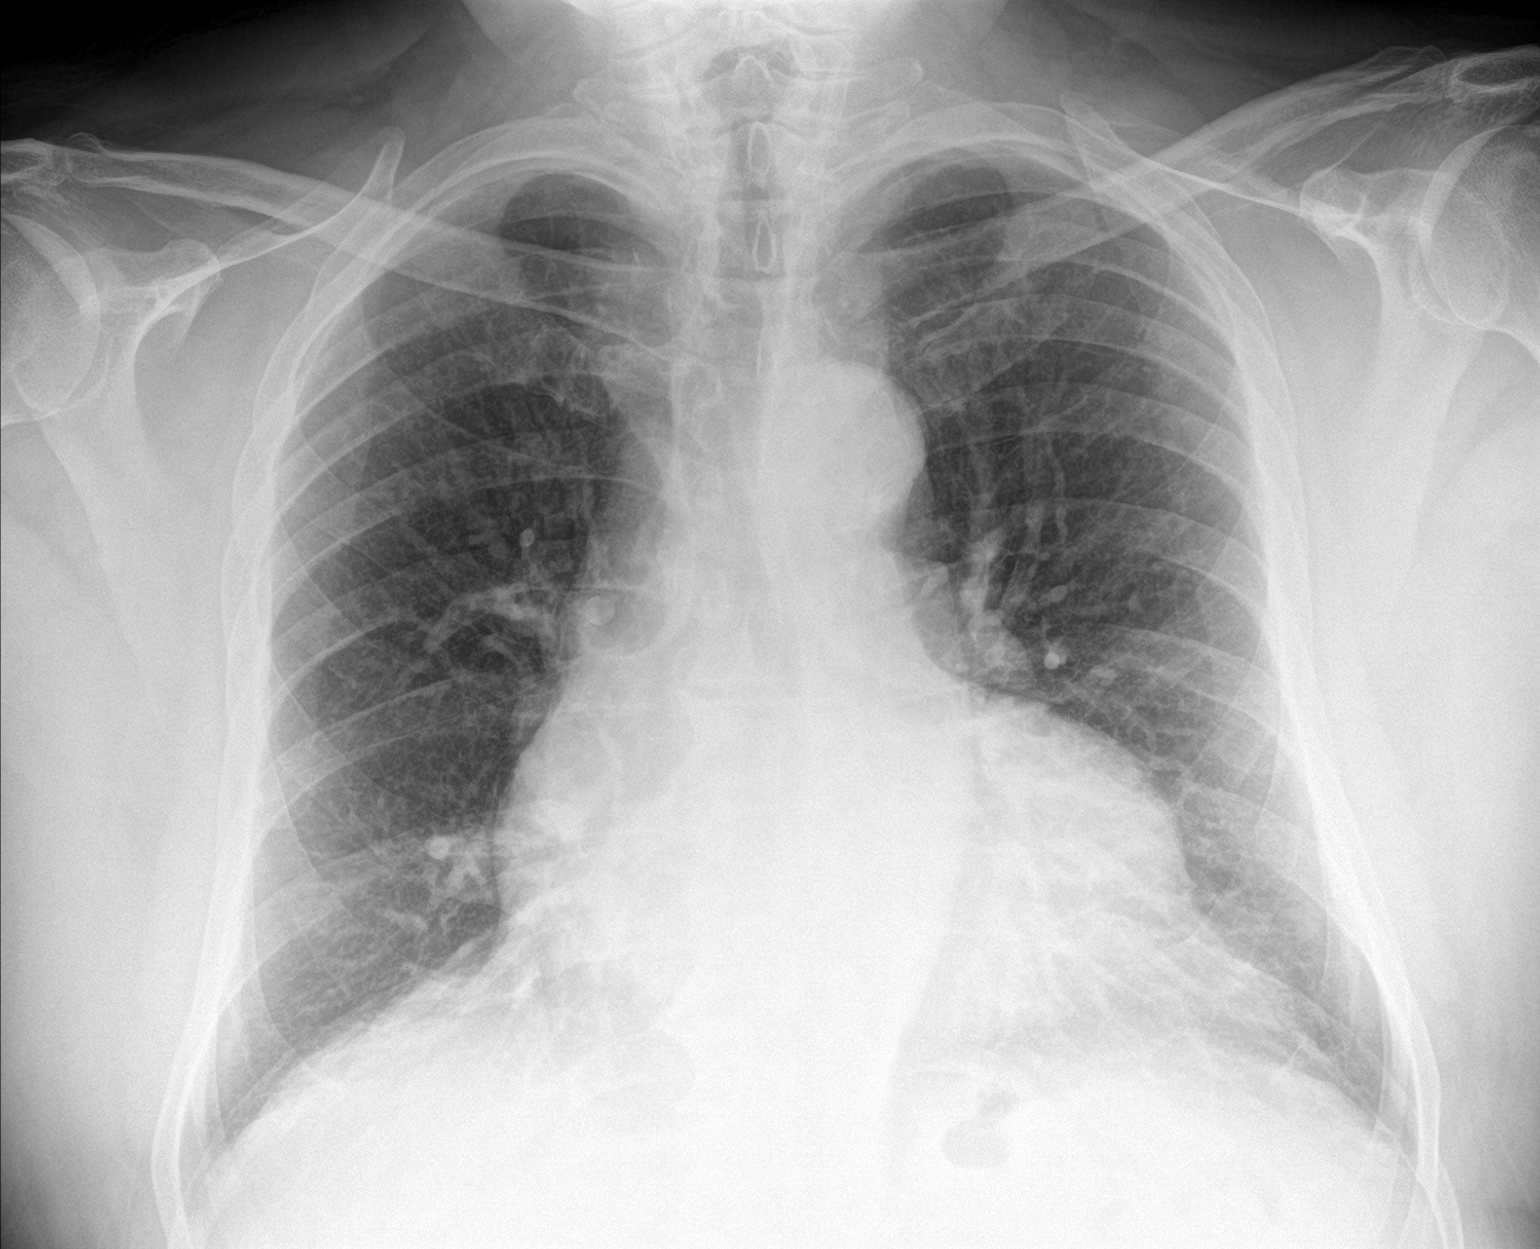

[chest lat]
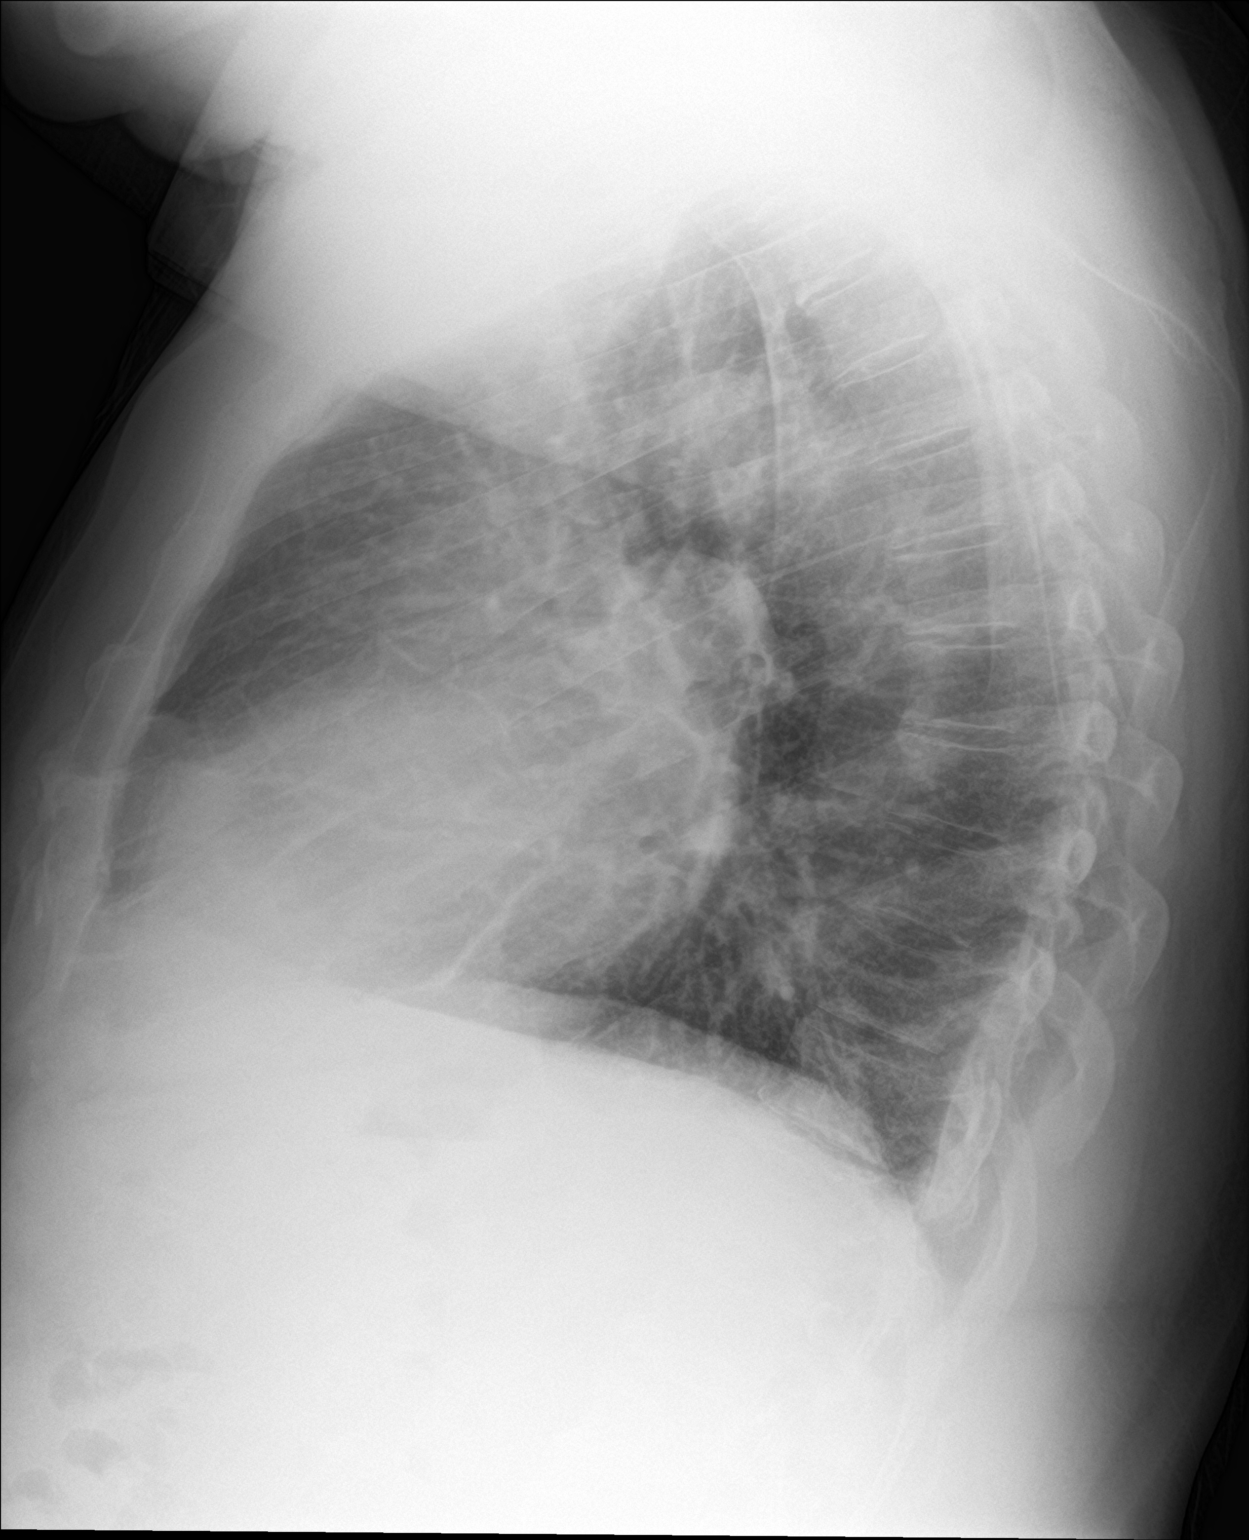

[2 of 2 positions shown; findings below may reference images not displayed]

FINDINGS: Stable cardiomediastinal silhouette with top-normal heart size. No
pneumothorax. No pleural effusion. Lungs appear clear, with no acute
consolidative airspace disease and no pulmonary edema.
IMPRESSION: No active cardiopulmonary disease.

## 2023-11-27 DIAGNOSIS — E039 Hypothyroidism, unspecified: Secondary | ICD-10-CM | POA: Diagnosis not present

## 2023-11-27 DIAGNOSIS — R7303 Prediabetes: Secondary | ICD-10-CM | POA: Diagnosis not present

## 2023-11-27 DIAGNOSIS — E559 Vitamin D deficiency, unspecified: Secondary | ICD-10-CM | POA: Diagnosis not present

## 2023-12-03 DIAGNOSIS — F5101 Primary insomnia: Secondary | ICD-10-CM | POA: Diagnosis not present

## 2023-12-03 DIAGNOSIS — Z86711 Personal history of pulmonary embolism: Secondary | ICD-10-CM | POA: Diagnosis not present

## 2023-12-03 DIAGNOSIS — E785 Hyperlipidemia, unspecified: Secondary | ICD-10-CM | POA: Diagnosis not present

## 2023-12-03 DIAGNOSIS — Z7901 Long term (current) use of anticoagulants: Secondary | ICD-10-CM | POA: Diagnosis not present

## 2023-12-03 DIAGNOSIS — E039 Hypothyroidism, unspecified: Secondary | ICD-10-CM | POA: Diagnosis not present

## 2023-12-03 DIAGNOSIS — I10 Essential (primary) hypertension: Secondary | ICD-10-CM | POA: Diagnosis not present

## 2023-12-03 DIAGNOSIS — R7303 Prediabetes: Secondary | ICD-10-CM | POA: Diagnosis not present

## 2023-12-03 DIAGNOSIS — E559 Vitamin D deficiency, unspecified: Secondary | ICD-10-CM | POA: Diagnosis not present

## 2023-12-30 IMAGING — CT CT CERVICAL SPINE W/O CM
3 of 4 series · 12 of 35 positions shown, 14 images · non-contrast
Comparison: None.

CLINICAL DATA: Slipped and fell, small laceration to right side of
upper lip



[Series 5: sagittal bone · sagittal · 0.29mm/px · 5 of 84 slices shown, 6 images]
[im 28/84  bone]
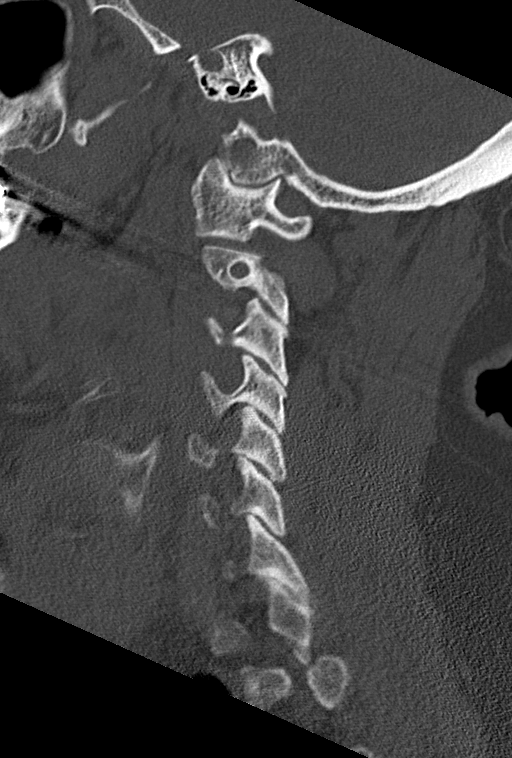
[im 35/84  bone]
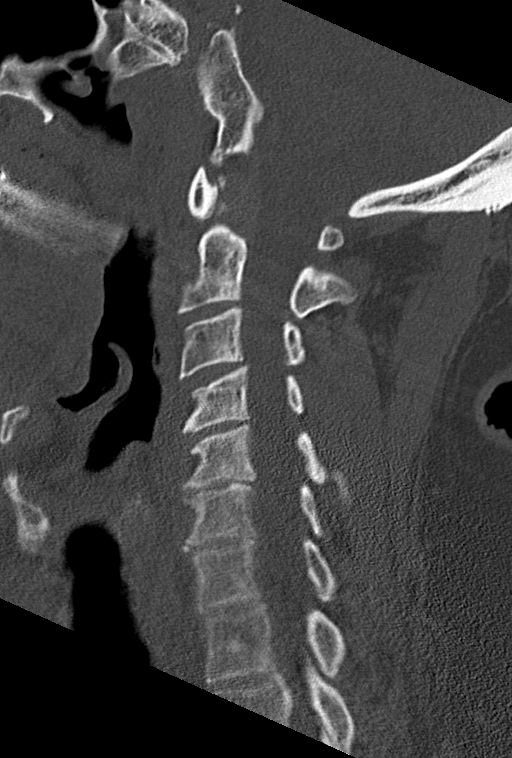
[im 42/84  soft-tissue]
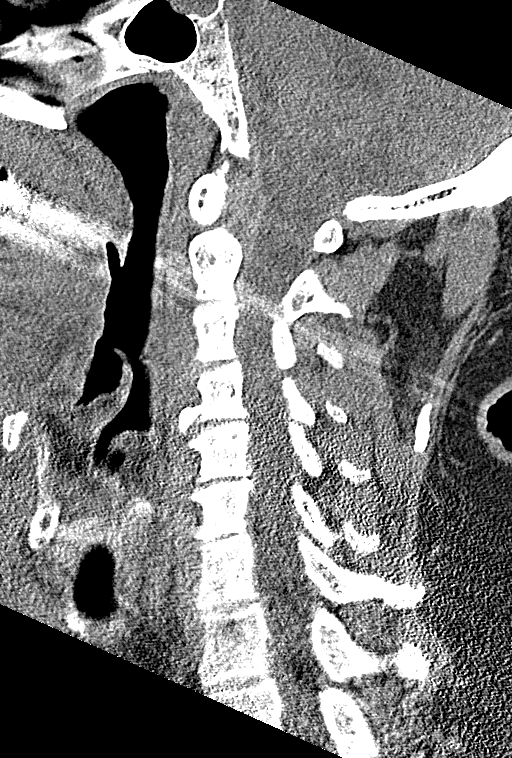
[im 42/84  bone]
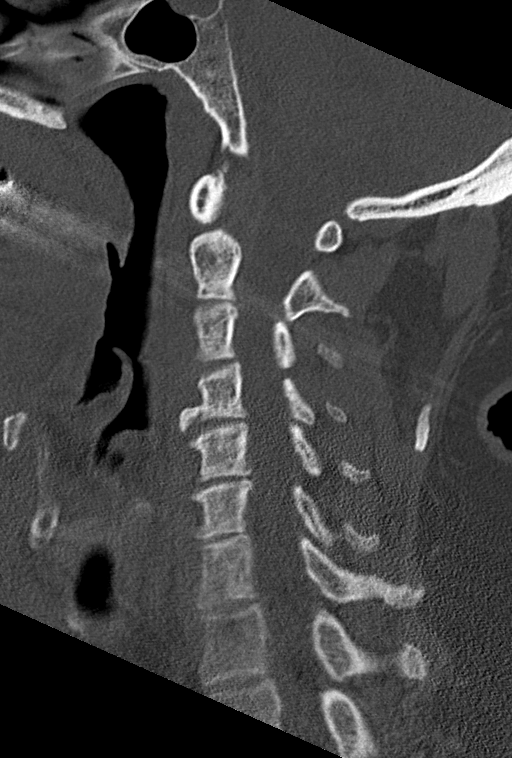
[im 49/84  bone]
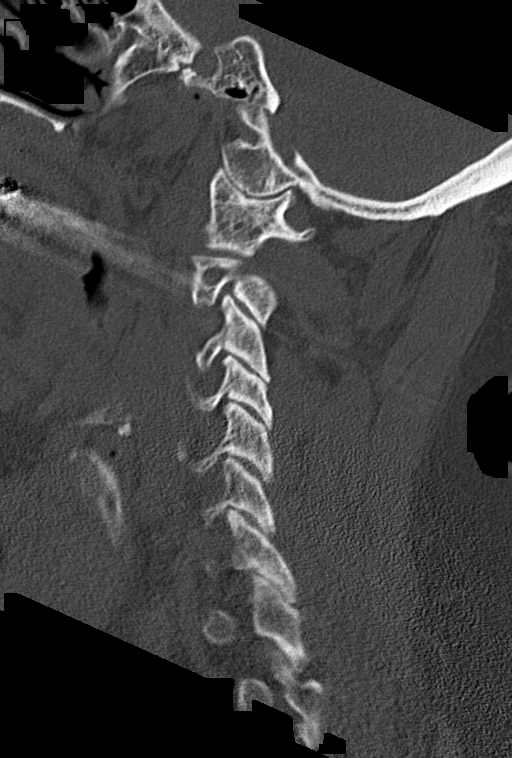
[im 56/84  bone]
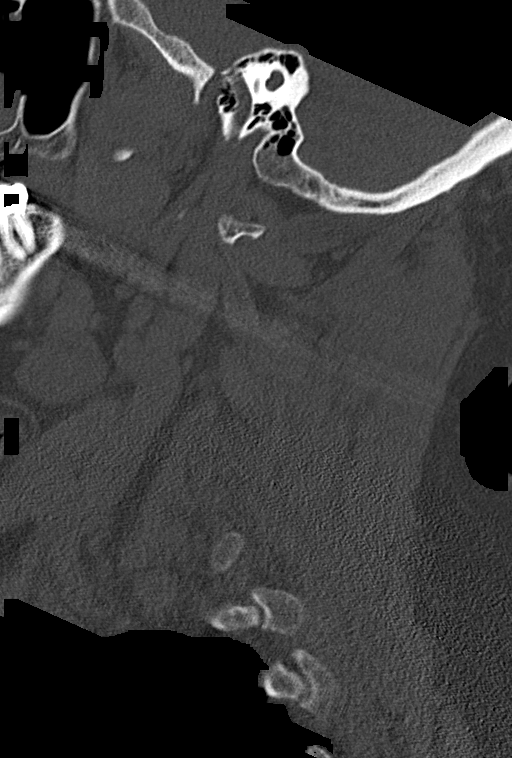

[Series 6: coronal bone · coronal · 0.33mm/px · 3 of 77 slices shown]
[im 19/77  bone]
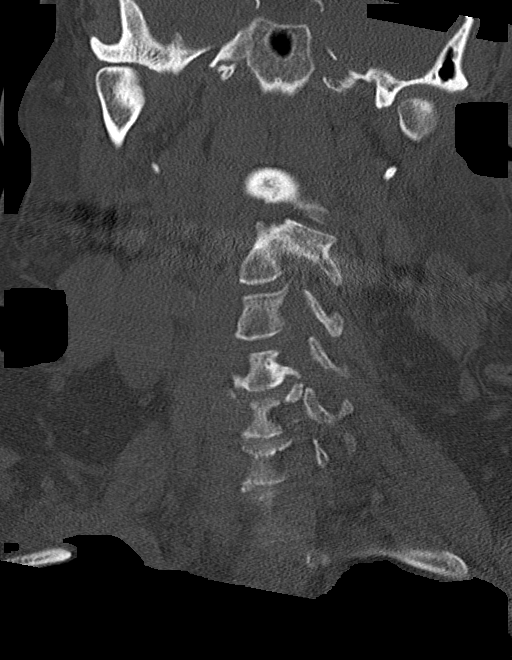
[im 32/77  bone]
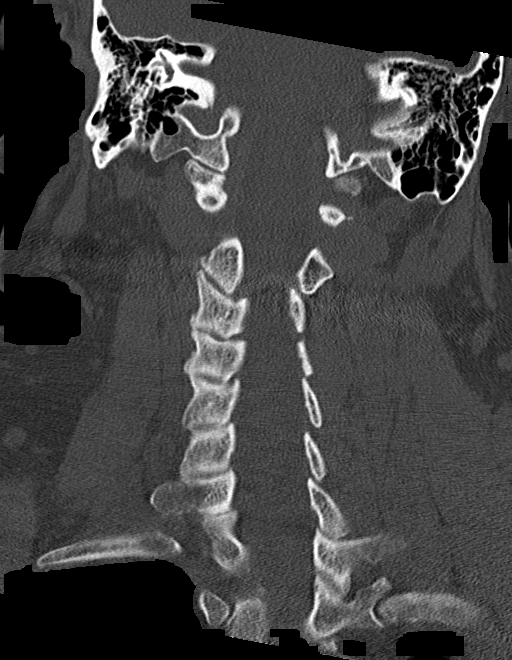
[im 45/77  bone]
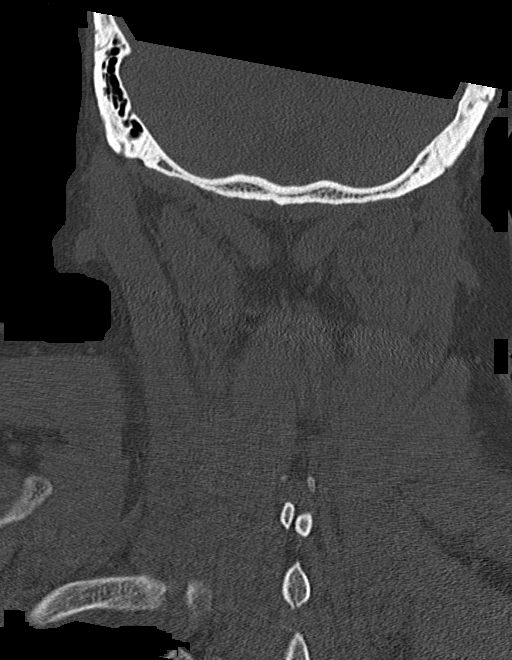

[Series 7: orthogonal bone · axial · 0.28mm/px · z∈[-261,-120]mm · 4 of 110 slices shown, 5 images]
[im 16/110  soft-tissue]
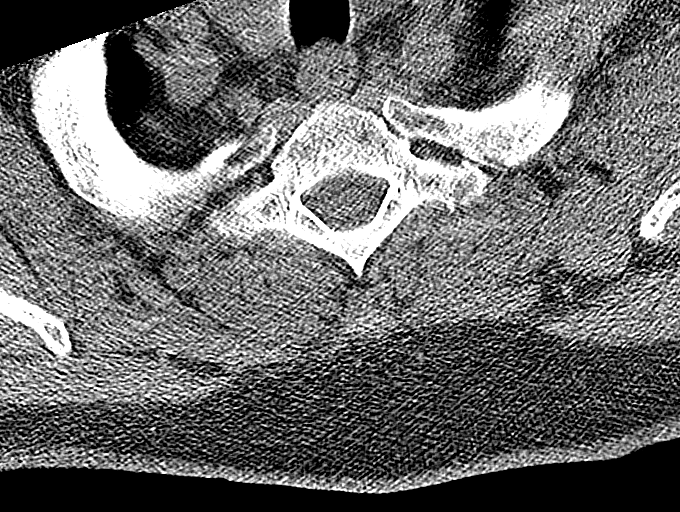
[im 16/110  bone]
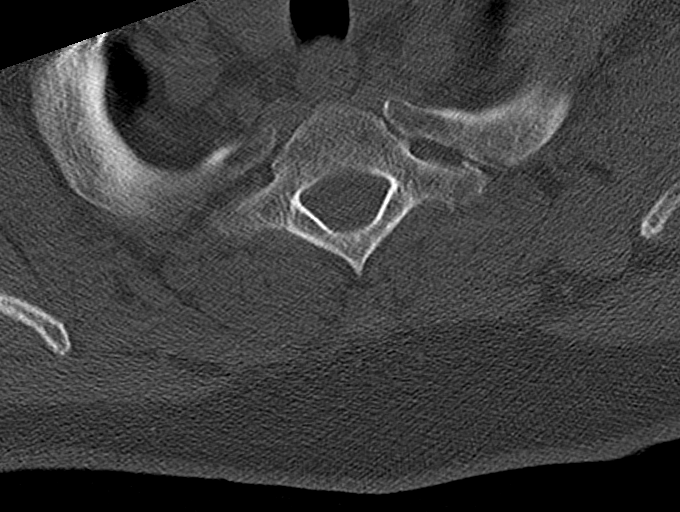
[im 47/110  bone]
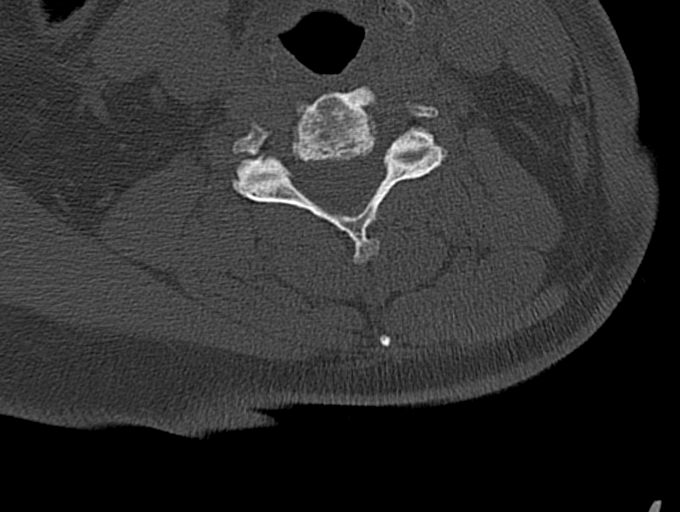
[im 63/110  bone]
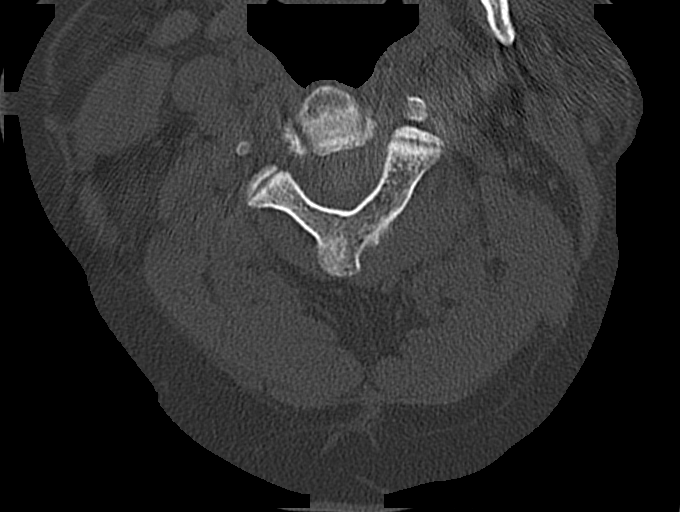
[im 94/110  bone]
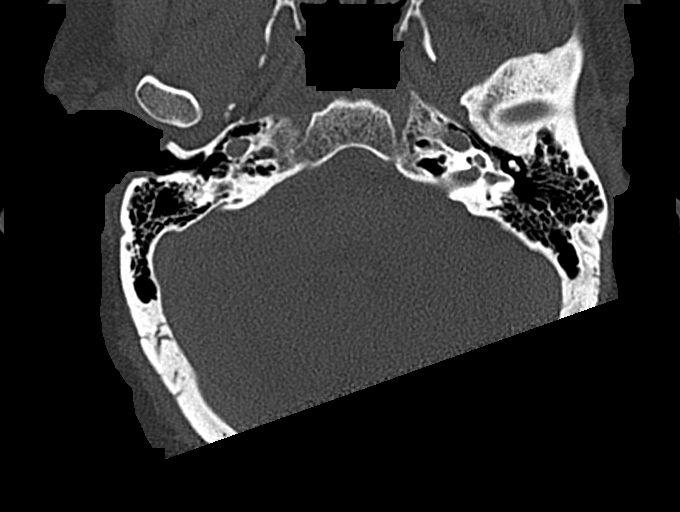

[12 of 35 positions shown; findings below may reference images not displayed]

FINDINGS: CT HEAD FINDINGS

Brain: No evidence of acute infarction, hemorrhage, cerebral edema,
mass, mass effect, or midline shift. No hydrocephalus or extra-axial
fluid collection.

Vascular: No hyperdense vessel.

Skull: Normal. Negative for fracture or focal lesion.

Sinuses/Orbits: No acute finding.

Other: The mastoid air cells are well aerated.

CT CERVICAL SPINE FINDINGS

Alignment: Mild straightening of the normal cervical lordosis, which
may be positional.

Skull base and vertebrae: No acute fracture. No primary bone lesion
or focal pathologic process. Congenital nonunion of the posterior
arch of C1, C5, and C6.

Soft tissues and spinal canal: No prevertebral fluid or swelling. No
visible canal hematoma.

Disc levels: No high-grade spinal canal stenosis. Multilevel
uncovertebral and facet arthropathy which causes moderate neural
foraminal narrowing bilaterally at C5-C6 and C6-C7.

Upper chest: Negative.

Other: None.
IMPRESSION: 1.  No acute intracranial process.
2.  No acute fracture or traumatic listhesis in the cervical spine.

## 2024-03-22 ENCOUNTER — Other Ambulatory Visit: Payer: Self-pay

## 2024-03-22 ENCOUNTER — Emergency Department
Admission: EM | Admit: 2024-03-22 | Discharge: 2024-03-23 | Disposition: A | Attending: Emergency Medicine | Admitting: Emergency Medicine

## 2024-03-22 ENCOUNTER — Encounter: Payer: Self-pay | Admitting: Emergency Medicine

## 2024-03-22 DIAGNOSIS — K625 Hemorrhage of anus and rectum: Secondary | ICD-10-CM | POA: Diagnosis not present

## 2024-03-22 DIAGNOSIS — Z859 Personal history of malignant neoplasm, unspecified: Secondary | ICD-10-CM | POA: Diagnosis not present

## 2024-03-22 DIAGNOSIS — J45909 Unspecified asthma, uncomplicated: Secondary | ICD-10-CM | POA: Insufficient documentation

## 2024-03-22 DIAGNOSIS — K921 Melena: Secondary | ICD-10-CM | POA: Diagnosis present

## 2024-03-22 DIAGNOSIS — Z7901 Long term (current) use of anticoagulants: Secondary | ICD-10-CM | POA: Diagnosis not present

## 2024-03-22 DIAGNOSIS — R195 Other fecal abnormalities: Secondary | ICD-10-CM

## 2024-03-22 LAB — CBC
HCT: 44.2 % (ref 39.0–52.0)
Hemoglobin: 14.4 g/dL (ref 13.0–17.0)
MCH: 28.2 pg (ref 26.0–34.0)
MCHC: 32.6 g/dL (ref 30.0–36.0)
MCV: 86.5 fL (ref 80.0–100.0)
Platelets: 171 K/uL (ref 150–400)
RBC: 5.11 MIL/uL (ref 4.22–5.81)
RDW: 13.6 % (ref 11.5–15.5)
WBC: 7.6 K/uL (ref 4.0–10.5)
nRBC: 0 % (ref 0.0–0.2)

## 2024-03-22 LAB — COMPREHENSIVE METABOLIC PANEL WITH GFR
ALT: 18 U/L (ref 0–44)
AST: 26 U/L (ref 15–41)
Albumin: 3.7 g/dL (ref 3.5–5.0)
Alkaline Phosphatase: 52 U/L (ref 38–126)
Anion gap: 10 (ref 5–15)
BUN: 20 mg/dL (ref 8–23)
CO2: 27 mmol/L (ref 22–32)
Calcium: 8.7 mg/dL — ABNORMAL LOW (ref 8.9–10.3)
Chloride: 99 mmol/L (ref 98–111)
Creatinine, Ser: 1.17 mg/dL (ref 0.61–1.24)
GFR, Estimated: >60 mL/min (ref >60)
Glucose, Bld: 114 mg/dL — ABNORMAL HIGH (ref 70–99)
Potassium: 3.2 mmol/L — ABNORMAL LOW (ref 3.5–5.1)
Sodium: 136 mmol/L (ref 135–145)
Total Bilirubin: 1.1 mg/dL (ref 0.0–1.2)
Total Protein: 6.9 g/dL (ref 6.5–8.1)

## 2024-03-22 LAB — TYPE AND SCREEN
ABO/RH(D): B POS
Antibody Screen: NEGATIVE

## 2024-03-22 LAB — PROTIME-INR
INR: 1.3 — ABNORMAL HIGH (ref 0.8–1.2)
Prothrombin Time: 16.6 s — ABNORMAL HIGH (ref 11.4–15.2)

## 2024-03-22 NOTE — ED Triage Notes (Signed)
 Pt to ED via POV with c/o rectal bleeding, pt states started on Wednesday, noted a funny smell, and noted that after having a bowel movement it was slimy. Pt endorses dark in nature.Pt states he is on blood thinner, Eliquis . Pt is alert and oriented. Pt denies denies any pain at this time. Pt also states that he had extremely low energy for 1 day.

## 2024-03-22 NOTE — ED Provider Notes (Signed)
 [facility] Provider Note    Event Date/Time   First MD Initiated Contact with Patient 03/22/24 2327     (approximate)   History   Rectal Bleeding   HPI  James Acevedo is a 83 y.o. male   Past medical history of asthma, cancer, DVT on Eliquis  here with dark stools.  Has noted dark stools for the last 3 to 4 days.  There is no associated pain.  He does not drink alcohol or use NSAIDs.  He has been compliant with his Eliquis .    After noticing dark stools for 2 days, loose stools, he took Pepto-Bismol 2 days ago to try to help.  Independent Historian contributed to assessment above: Girlfriend at bedside corroborates information above  External Medical Documents Reviewed: Prior endoscopy noted polyp      Physical Exam   Triage Vital Signs: ED Triage Vitals [03/22/24 1947]  Encounter Vitals Group     BP (!) 99/50     Girls Systolic BP Percentile      Girls Diastolic BP Percentile      Boys Systolic BP Percentile      Boys Diastolic BP Percentile      Pulse Rate 87     Resp 20     Temp 98.7 F (37.1 C)     Temp Source Oral     SpO2 96 %     Weight 265 lb (120.2 kg)     Height 6' 2 (1.88 m)     Head Circumference      Peak Flow      Pain Score 0     Pain Loc      Pain Education      Exclude from Growth Chart     Most recent vital signs: Vitals:   03/22/24 2330 03/23/24 0000  BP: 125/63 108/65  Pulse: 67 69  Resp: 18   Temp:    SpO2: 97% 96%    General: Awake, no distress.  CV:  Good peripheral perfusion.  Resp:  Normal effort.  Abd:  No distention.  Other:  Soft nontender nonperitoneal abdomen.  Rectal exam with semisolid black appearing stool that is heme occult negative.   ED Results / Procedures / Treatments   Labs (all labs ordered are listed, but only abnormal results are displayed) Labs Reviewed  COMPREHENSIVE METABOLIC PANEL WITH GFR - Abnormal; Notable for the following components:      Result Value    Potassium 3.2 (*)    Glucose, Bld 114 (*)    Calcium 8.7 (*)    All other components within normal limits  PROTIME-INR - Abnormal; Notable for the following components:   Prothrombin Time 16.6 (*)    INR 1.3 (*)    All other components within normal limits  CBC  TYPE AND SCREEN     I ordered and reviewed the above labs they are notable for normal H&H PROCEDURES:  Critical Care performed: No  Procedures   MEDICATIONS ORDERED IN ED: Medications - No data to display  IMPRESSION / MDM / ASSESSMENT AND PLAN / ED COURSE  I reviewed the triage vital signs and the nursing notes.                                Patient's presentation is most consistent with acute presentation with potential threat to life or bodily function.  Differential diagnosis includes, but is not limited  to, melena, GI bleed, anemia   The patient is on the cardiac monitor to evaluate for evidence of arrhythmia and/or significant heart rate changes.  MDM:    Dark stools in this anticoagulated patient makes me concerned for GI bleeding.  Fortunately with 3 to 4 days of dark stools and a normal vital signs and normal H&H I think this is less likely to be a significant blood loss.  Furthermore Hemoccult testing is negative which is further reassuring.  Very benign abdominal exam rules against abdominal emergencies at this time.  I considered hospitalization for admission or observation given reassuring evaluation as above, I think follow-up with his PMD/GI as needed outpatient.        FINAL CLINICAL IMPRESSION(S) / ED DIAGNOSES   Final diagnoses:  Dark stools  Rectal bleeding     Rx / DC Orders   ED Discharge Orders          Ordered    Ambulatory referral to Gastroenterology        03/23/24 0022             Note:  This document was prepared using Dragon voice recognition software and may include unintentional dictation errors.    Cyrena Mylar, MD 03/23/24 [phone]

## 2024-03-23 ENCOUNTER — Other Ambulatory Visit: Payer: Self-pay

## 2024-03-23 ENCOUNTER — Emergency Department
Admission: EM | Admit: 2024-03-23 | Discharge: 2024-03-24 | Disposition: A | Discharge status: Home / Self Care | Attending: Emergency Medicine | Admitting: Emergency Medicine

## 2024-03-23 DIAGNOSIS — Z859 Personal history of malignant neoplasm, unspecified: Secondary | ICD-10-CM | POA: Insufficient documentation

## 2024-03-23 DIAGNOSIS — J45909 Unspecified asthma, uncomplicated: Secondary | ICD-10-CM | POA: Insufficient documentation

## 2024-03-23 DIAGNOSIS — R42 Dizziness and giddiness: Secondary | ICD-10-CM | POA: Diagnosis present

## 2024-03-23 DIAGNOSIS — Z7901 Long term (current) use of anticoagulants: Secondary | ICD-10-CM | POA: Insufficient documentation

## 2024-03-23 DIAGNOSIS — I82409 Acute embolism and thrombosis of unspecified deep veins of unspecified lower extremity: Secondary | ICD-10-CM | POA: Diagnosis not present

## 2024-03-23 LAB — CBC
HCT: 42.5 % (ref 39.0–52.0)
Hemoglobin: 14 g/dL (ref 13.0–17.0)
MCH: 28.6 pg (ref 26.0–34.0)
MCHC: 32.9 g/dL (ref 30.0–36.0)
MCV: 86.7 fL (ref 80.0–100.0)
Platelets: 174 K/uL (ref 150–400)
RBC: 4.9 MIL/uL (ref 4.22–5.81)
RDW: 13.8 % (ref 11.5–15.5)
WBC: 6.8 K/uL (ref 4.0–10.5)
nRBC: 0 % (ref 0.0–0.2)

## 2024-03-23 LAB — COMPREHENSIVE METABOLIC PANEL WITH GFR
ALT: 20 U/L (ref 0–44)
AST: 28 U/L (ref 15–41)
Albumin: 3.5 g/dL (ref 3.5–5.0)
Alkaline Phosphatase: 52 U/L (ref 38–126)
Anion gap: 11 (ref 5–15)
BUN: 20 mg/dL (ref 8–23)
CO2: 27 mmol/L (ref 22–32)
Calcium: 8.7 mg/dL — ABNORMAL LOW (ref 8.9–10.3)
Chloride: 99 mmol/L (ref 98–111)
Creatinine, Ser: 1.16 mg/dL (ref 0.61–1.24)
GFR, Estimated: >60 mL/min (ref >60)
Glucose, Bld: 106 mg/dL — ABNORMAL HIGH (ref 70–99)
Potassium: 3.3 mmol/L — ABNORMAL LOW (ref 3.5–5.1)
Sodium: 137 mmol/L (ref 135–145)
Total Bilirubin: 1.2 mg/dL (ref 0.0–1.2)
Total Protein: 6.9 g/dL (ref 6.5–8.1)

## 2024-03-23 MED ORDER — SODIUM CHLORIDE 0.9 % IV BOLUS
1000.0000 mL | Freq: Once | INTRAVENOUS | Status: AC
  Administered 2024-03-24: 1000 mL via INTRAVENOUS

## 2024-03-23 NOTE — ED Notes (Signed)
 EDP at bedside

## 2024-03-23 NOTE — ED Provider Notes (Signed)
 [facility] Provider Note    Event Date/Time   First MD Initiated Contact with Patient 03/23/24 2350     (approximate)   History   Dizziness   HPI  James Acevedo is a 83 y.o. male   Past medical history of asthma, cancer, DVT on Eliquis , here with lightheadedness/dizziness/room spinning sensation.  This got a lot worse yesterday.  He was seen in the emergency department by myself with dark stools for the last 3 to 4 days, liquid stools for the last 3 to 4 days.  At that time negative workup including a Hemoccult, H&H.  He has not had a bowel movement and has minimal flatus since yesterday.  He denies any weakness or sensory loss or nausea.  He denies palpitations, chest pain, shortness of breath.  He has no abdominal pain.  He was seen at the [facility] and they did not have imaging capacity so came to the emergency department here.  Independent Historian contributed to assessment above: Girlfriend at bedside corroborates information above    Physical Exam   Triage Vital Signs: ED Triage Vitals [03/23/24 1839]  Encounter Vitals Group     BP 110/72     Girls Systolic BP Percentile      Girls Diastolic BP Percentile      Boys Systolic BP Percentile      Boys Diastolic BP Percentile      Pulse Rate 70     Resp 18     Temp 98.1 F (36.7 C)     Temp Source Oral     SpO2 98 %     Weight      Height      Head Circumference      Peak Flow      Pain Score 0     Pain Loc      Pain Education      Exclude from Growth Chart     Most recent vital signs: Vitals:   03/23/24 1839 03/24/24 0100  BP: 110/72 120/67  Pulse: 70 70  Resp: 18 17  Temp: 98.1 F (36.7 C) 98.1 F (36.7 C)  SpO2: 98% 99%    General: Awake, no distress.  CV:  Good peripheral perfusion.  Resp:  Normal effort.  Abd:  No distention.  Other:  Comfortable appearing gentleman in no acute distress, pleasant, with normal vital signs.  He is ambulatory with steady gait.  No focal  neurologic deficits including facial asymmetry, dysarthria, motor or sensory changes, coordination or gait abnormalities.  He has a soft nontender nonperitoneal abdominal exam.  Has no nystagmus.   ED Results / Procedures / Treatments   Labs (all labs ordered are listed, but only abnormal results are displayed) Labs Reviewed  COMPREHENSIVE METABOLIC PANEL WITH GFR - Abnormal; Notable for the following components:      Result Value   Potassium 3.3 (*)    Glucose, Bld 106 (*)    Calcium 8.7 (*)    All other components within normal limits  CBC     I ordered and reviewed the above labs they are notable for cell counts electrolytes largely unremarkable  EKG  ED ECG REPORT I, Ginnie Shams, the attending physician, personally viewed and interpreted this ECG.   Date: 03/23/2024  EKG Time: 1845  Rate: 73  Rhythm: sinus  Axis: nl  Intervals: lbbb  ST&T Change: no stemi    RADIOLOGY I independently reviewed and interpreted CT scan of the abdomen and pelvis  and see no obvious obstructive or inflammatory changes I also reviewed radiologist's formal read.   PROCEDURES:  Critical Care performed: No  Procedures   MEDICATIONS ORDERED IN ED: Medications  sodium chloride  0.9 % bolus 1,000 mL (1,000 mLs Intravenous New Bag/Given 03/24/24 0108)  meclizine (ANTIVERT) tablet 12.5 mg (12.5 mg Oral Given 03/24/24 0113)    IMPRESSION / MDM / ASSESSMENT AND PLAN / ED COURSE  I reviewed the triage vital signs and the nursing notes.                                Patient's presentation is most consistent with acute presentation with potential threat to life or bodily function.  Differential diagnosis includes, but is not limited to, dehydration, peripheral vertigo, central vertigo or stroke, obstruction or intra-abdominal infection, diarrheal illness   The patient is on the cardiac monitor to evaluate for evidence of arrhythmia and/or significant heart rate changes.  MDM:     Today's chief complaint is worsening dizziness.  This is in the setting of loose dark stools over the last couple days that have completely stopped up over the last 24 hours.  Most likely dizziness is attributed to either mild dehydration in setting of diarrheal illness, or peripheral vertigo.  Give fluids, give meclizine.    No other focal neurologic deficits to strongly suggest stroke.  However given clinical exam not consistent with severe dehydration, normal electrolytes, normal kidney function, and no nystagmus or reproducible vertigo on head movement, will get an MRI of the brain to rule out stroke.  In terms of his possible bleeding, negative exam yesterday and normal stable H&H with no further stool output in the last 24 hours, highly unlikely GI bleed affecting his dizziness.  However given no bowel movement and minimal flatus for the last 24 hours, [facility].  I will order to rule out intra-abdominal emergencies.  Fortunately evaluation as above is negative.  I considered hospitalization for admission or observation however given unremarkable evaluation as above and the stable patient, I think he can be further managed as an outpatient.        FINAL CLINICAL IMPRESSION(S) / ED DIAGNOSES   Final diagnoses:  Dizziness  Vertigo     Rx / DC Orders   ED Discharge Orders          Ordered    meclizine (ANTIVERT) 12.5 MG tablet  3 times daily PRN        03/24/24 0141             Note:  This document was prepared using Dragon voice recognition software and may include unintentional dictation errors.    Cyrena Mylar, MD 03/24/24 [phone]

## 2024-03-23 NOTE — Discharge Instructions (Addendum)
 Your previous gastroenterologist was Dr. Thresa Sale - [facility] [place] [address] [place], [place] [address]  [phone]  -- If you are unable to schedule an appointment with Dr. Sale, I have made a referral to a gastroenterologist who can set up an appointment for you within our health system.  Thank you for choosing us  for your health care today!  Please see your primary doctor this week for a follow up appointment.   If you have any new, worsening, or unexpected symptoms call your doctor right away or come back to the emergency department for reevaluation.  It was my pleasure to care for you today.   Ginnie EDISON Cyrena, MD

## 2024-03-23 NOTE — ED Provider Notes (Incomplete)
 [facility] Provider Note    Event Date/Time   First MD Initiated Contact with Patient 03/23/24 2350     (approximate)   History   Dizziness   HPI  James Acevedo is a 83 y.o. male   Past medical history of ***    Independent Historian contributed to assessment above: ***  External Medical Documents Reviewed: ***      Physical Exam   Triage Vital Signs: ED Triage Vitals [03/23/24 1839]  Encounter Vitals Group     BP 110/72     Girls Systolic BP Percentile      Girls Diastolic BP Percentile      Boys Systolic BP Percentile      Boys Diastolic BP Percentile      Pulse Rate 70     Resp 18     Temp 98.1 F (36.7 C)     Temp Source Oral     SpO2 98 %     Weight      Height      Head Circumference      Peak Flow      Pain Score 0     Pain Loc      Pain Education      Exclude from Growth Chart     Most recent vital signs: Vitals:   03/23/24 1839  BP: 110/72  Pulse: 70  Resp: 18  Temp: 98.1 F (36.7 C)  SpO2: 98%    General: Awake, no distress. *** CV:  Good peripheral perfusion. *** Resp:  Normal effort. *** Abd:  No distention. *** Other:  ***   ED Results / Procedures / Treatments   Labs (all labs ordered are listed, but only abnormal results are displayed) Labs Reviewed  COMPREHENSIVE METABOLIC PANEL WITH GFR - Abnormal; Notable for the following components:      Result Value   Potassium 3.3 (*)    Glucose, Bld 106 (*)    Calcium 8.7 (*)    All other components within normal limits  CBC  URINALYSIS, ROUTINE W REFLEX MICROSCOPIC     I ordered and reviewed the above labs they are notable for ***  EKG  ED ECG REPORT I, Ginnie Shams, the attending physician, personally viewed and interpreted this ECG.   Date: 03/23/2024  EKG Time: ***  Rate: ***  Rhythm: {ekg findings:315101}  Axis: ***  Intervals:{conduction defects:17367}  ST&T Change: ***    RADIOLOGY I independently reviewed and interpreted  *** I also reviewed radiologist's formal read.   PROCEDURES:  Critical Care performed: {CriticalCareYesNo:19197::Yes, see critical care procedure note(s),No}  Procedures   MEDICATIONS ORDERED IN ED: Medications - No data to display  External physician / consultants:  I spoke with *** regarding care plan for this patient.   IMPRESSION / MDM / ASSESSMENT AND PLAN / ED COURSE  I reviewed the triage vital signs and the nursing notes.                                Patient's presentation is most consistent with {EM COPA:27473}  Differential diagnosis includes, but is not limited to, ***   ***The patient is on the cardiac monitor to evaluate for evidence of arrhythmia and/or significant heart rate changes.  MDM:  ***  I considered hospitalization for admission or observation ***        FINAL CLINICAL IMPRESSION(S) / ED DIAGNOSES   Final diagnoses:  None     Rx / DC Orders   ED Discharge Orders     None        Note:  This document was prepared using Dragon voice recognition software and may include unintentional dictation errors.

## 2024-03-23 NOTE — ED Triage Notes (Signed)
 Pt to ED via POV from home. Pt reports feeling swimmy headed. Pt reports was seen yesterday for dark stools and work up was negative. Pt was seen at [facility] today and sent back here for further imaging due to not having CT scanner.

## 2024-03-24 ENCOUNTER — Emergency Department

## 2024-03-24 MED ORDER — MECLIZINE HCL 25 MG PO TABS
12.5000 mg | ORAL_TABLET | Freq: Once | ORAL | Status: AC
Start: 2024-03-24 — End: 2024-03-24
  Administered 2024-03-24: 12.5 mg via ORAL
  Filled 2024-03-24: qty 1

## 2024-03-24 MED ORDER — MECLIZINE HCL 12.5 MG PO TABS: 12.5000 mg | ORAL_TABLET | Freq: Three times a day (TID) | ORAL | 0 refills | Status: AC | PRN

## 2024-03-24 NOTE — ED Notes (Signed)
 In room to administer meds - pt in MRI per visitor

## 2024-03-24 NOTE — ED Notes (Signed)
 Pts visitor given water  per request - informed that pt may not have anything to drink until scans are back

## 2024-03-24 NOTE — Discharge Instructions (Addendum)
 Fortunately your evaluation in the emergency department did not show any dangerous conditions in your abdomen or your brain that account for your symptoms today.  Take meclizine as prescribed which may help with your dizziness/vertigo.  Make sure you stay well-hydrated to avoid dehydration.  If you continue to feel that room spinning sensation over the next 2 to 3 days you can call Dr. Blair who is the ear specialist and vertigo specialist for further evaluation and treatment options.

## 2024-04-06 DIAGNOSIS — R7303 Prediabetes: Secondary | ICD-10-CM | POA: Diagnosis not present

## 2024-04-13 DIAGNOSIS — E039 Hypothyroidism, unspecified: Secondary | ICD-10-CM | POA: Diagnosis not present

## 2024-04-13 DIAGNOSIS — R195 Other fecal abnormalities: Secondary | ICD-10-CM | POA: Diagnosis not present

## 2024-04-13 DIAGNOSIS — Z86711 Personal history of pulmonary embolism: Secondary | ICD-10-CM | POA: Diagnosis not present

## 2024-04-13 DIAGNOSIS — E785 Hyperlipidemia, unspecified: Secondary | ICD-10-CM | POA: Diagnosis not present

## 2024-04-13 DIAGNOSIS — M5416 Radiculopathy, lumbar region: Secondary | ICD-10-CM | POA: Diagnosis not present

## 2024-04-13 DIAGNOSIS — I10 Essential (primary) hypertension: Secondary | ICD-10-CM | POA: Diagnosis not present

## 2024-04-13 DIAGNOSIS — R7303 Prediabetes: Secondary | ICD-10-CM | POA: Diagnosis not present

## 2024-04-13 DIAGNOSIS — Z7901 Long term (current) use of anticoagulants: Secondary | ICD-10-CM | POA: Diagnosis not present

## 2024-04-21 DIAGNOSIS — M545 Low back pain, unspecified: Secondary | ICD-10-CM | POA: Diagnosis not present

## 2024-04-24 DIAGNOSIS — M545 Low back pain, unspecified: Secondary | ICD-10-CM | POA: Diagnosis not present

## 2024-05-19 DIAGNOSIS — M545 Low back pain, unspecified: Secondary | ICD-10-CM | POA: Diagnosis not present

## 2024-05-26 DIAGNOSIS — M545 Low back pain, unspecified: Secondary | ICD-10-CM | POA: Diagnosis not present
